# Patient Record
Sex: Male | Born: 1950 | Race: White | Hispanic: No | Marital: Married | Smoking: Former smoker
Health system: Southern US, Community
[De-identification: ages and names within clinical notes are randomized; demographics above are authoritative.]

## PROBLEM LIST (undated history)

## (undated) DIAGNOSIS — M1990 Unspecified osteoarthritis, unspecified site: Secondary | ICD-10-CM

## (undated) DIAGNOSIS — T7840XA Allergy, unspecified, initial encounter: Secondary | ICD-10-CM

## (undated) DIAGNOSIS — R351 Nocturia: Principal | ICD-10-CM

## (undated) DIAGNOSIS — N401 Enlarged prostate with lower urinary tract symptoms: Secondary | ICD-10-CM

## (undated) DIAGNOSIS — E785 Hyperlipidemia, unspecified: Secondary | ICD-10-CM

## (undated) HISTORY — PX: KNEE ARTHROSCOPY: SUR90

## (undated) HISTORY — DX: Allergy, unspecified, initial encounter: T78.40XA

## (undated) HISTORY — DX: Hyperlipidemia, unspecified: E78.5

## (undated) HISTORY — DX: Nocturia: R35.1

## (undated) HISTORY — PX: ROTATOR CUFF REPAIR: SHX139

## (undated) HISTORY — PX: COLONOSCOPY: SHX174

## (undated) HISTORY — DX: Benign prostatic hyperplasia with lower urinary tract symptoms: N40.1

## (undated) HISTORY — DX: Unspecified osteoarthritis, unspecified site: M19.90

---

## 1971-02-02 HISTORY — PX: APPENDECTOMY: SHX54

## 2000-01-19 ENCOUNTER — Ambulatory Visit (HOSPITAL_BASED_OUTPATIENT_CLINIC_OR_DEPARTMENT_OTHER): Admission: RE | Admit: 2000-01-19 | Discharge: 2000-01-19 | Payer: Self-pay | Admitting: Orthopedic Surgery

## 2002-10-22 ENCOUNTER — Ambulatory Visit (HOSPITAL_COMMUNITY): Admission: RE | Admit: 2002-10-22 | Discharge: 2002-10-22 | Payer: Self-pay | Admitting: Gastroenterology

## 2006-03-01 ENCOUNTER — Ambulatory Visit: Payer: Self-pay | Admitting: Family Medicine

## 2006-08-11 ENCOUNTER — Ambulatory Visit: Payer: Self-pay | Admitting: Family Medicine

## 2006-08-12 ENCOUNTER — Ambulatory Visit: Payer: Self-pay | Admitting: Family Medicine

## 2006-08-14 ENCOUNTER — Emergency Department (HOSPITAL_COMMUNITY): Admission: EM | Admit: 2006-08-14 | Discharge: 2006-08-14 | Payer: Self-pay | Admitting: Emergency Medicine

## 2006-08-15 ENCOUNTER — Telehealth: Payer: Self-pay | Admitting: Family Medicine

## 2006-08-15 ENCOUNTER — Encounter: Payer: Self-pay | Admitting: Family Medicine

## 2006-08-15 DIAGNOSIS — J309 Allergic rhinitis, unspecified: Secondary | ICD-10-CM | POA: Insufficient documentation

## 2006-08-15 DIAGNOSIS — E785 Hyperlipidemia, unspecified: Secondary | ICD-10-CM | POA: Insufficient documentation

## 2006-09-07 ENCOUNTER — Ambulatory Visit: Payer: Self-pay | Admitting: Family Medicine

## 2006-09-07 LAB — CONVERTED CEMR LAB
ALT: 29 units/L (ref 0–53)
AST: 22 units/L (ref 0–37)
Albumin: 4.2 g/dL (ref 3.5–5.2)
Alkaline Phosphatase: 37 units/L — ABNORMAL LOW (ref 39–117)
BUN: 17 mg/dL (ref 6–23)
Basophils Absolute: 0 10*3/uL (ref 0.0–0.1)
Basophils Relative: 1 % (ref 0.0–1.0)
Bilirubin Urine: NEGATIVE
Bilirubin, Direct: 0.1 mg/dL (ref 0.0–0.3)
Blood in Urine, dipstick: NEGATIVE
CO2: 29 meq/L (ref 19–32)
Calcium: 9.3 mg/dL (ref 8.4–10.5)
Chloride: 106 meq/L (ref 96–112)
Cholesterol: 172 mg/dL (ref 0–200)
Creatinine, Ser: 1.1 mg/dL (ref 0.4–1.5)
Eosinophils Absolute: 0.1 10*3/uL (ref 0.0–0.6)
Eosinophils Relative: 2.4 % (ref 0.0–5.0)
GFR calc Af Amer: 89 mL/min
GFR calc non Af Amer: 73 mL/min
Glucose, Bld: 103 mg/dL — ABNORMAL HIGH (ref 70–99)
Glucose, Urine, Semiquant: NEGATIVE
HCT: 42.6 % (ref 39.0–52.0)
HDL: 43.2 mg/dL (ref >39.0)
Hemoglobin: 14.7 g/dL (ref 13.0–17.0)
Ketones, urine, test strip: NEGATIVE
LDL Cholesterol: 113 mg/dL — ABNORMAL HIGH (ref 0–99)
Lymphocytes Relative: 35 % (ref 12.0–46.0)
MCHC: 34.5 g/dL (ref 30.0–36.0)
MCV: 90.8 fL (ref 78.0–100.0)
Monocytes Absolute: 0.5 10*3/uL (ref 0.2–0.7)
Monocytes Relative: 10.4 % (ref 3.0–11.0)
Neutro Abs: 2.6 10*3/uL (ref 1.4–7.7)
Neutrophils Relative %: 51.2 % (ref 43.0–77.0)
Nitrite: NEGATIVE
PSA: 1.01 ng/mL (ref 0.10–4.00)
Platelets: 176 10*3/uL (ref 150–400)
Potassium: 4 meq/L (ref 3.5–5.1)
Protein, U semiquant: NEGATIVE
RBC: 4.69 M/uL (ref 4.22–5.81)
RDW: 12.6 % (ref 11.5–14.6)
Sodium: 141 meq/L (ref 135–145)
Specific Gravity, Urine: 1.02
TSH: 0.8 microintl units/mL (ref 0.35–5.50)
Total Bilirubin: 0.7 mg/dL (ref 0.3–1.2)
Total CHOL/HDL Ratio: 4
Total Protein: 6.6 g/dL (ref 6.0–8.3)
Triglycerides: 81 mg/dL (ref 0–149)
Urobilinogen, UA: 0.2
VLDL: 16 mg/dL (ref 0–40)
WBC Urine, dipstick: NEGATIVE
WBC: 4.9 10*3/uL (ref 4.5–10.5)
pH: 5.5

## 2006-09-14 ENCOUNTER — Ambulatory Visit: Payer: Self-pay | Admitting: Family Medicine

## 2006-11-14 ENCOUNTER — Ambulatory Visit: Payer: Self-pay | Admitting: Family Medicine

## 2006-11-14 LAB — CONVERTED CEMR LAB
Cholesterol: 200 mg/dL (ref 0–200)
HDL: 41.8 mg/dL (ref >39.0)
LDL Cholesterol: 145 mg/dL — ABNORMAL HIGH (ref 0–99)
Total CHOL/HDL Ratio: 4.8
Triglycerides: 68 mg/dL (ref 0–149)
VLDL: 14 mg/dL (ref 0–40)

## 2006-11-17 ENCOUNTER — Telehealth: Payer: Self-pay | Admitting: Family Medicine

## 2007-03-13 DIAGNOSIS — J069 Acute upper respiratory infection, unspecified: Secondary | ICD-10-CM | POA: Insufficient documentation

## 2007-03-16 ENCOUNTER — Ambulatory Visit: Payer: Self-pay | Admitting: Family Medicine

## 2007-03-28 ENCOUNTER — Ambulatory Visit: Payer: Self-pay | Admitting: Family Medicine

## 2007-03-28 DIAGNOSIS — H6590 Unspecified nonsuppurative otitis media, unspecified ear: Secondary | ICD-10-CM | POA: Insufficient documentation

## 2007-06-14 ENCOUNTER — Ambulatory Visit: Payer: Self-pay | Admitting: Family Medicine

## 2007-06-14 ENCOUNTER — Encounter: Payer: Self-pay | Admitting: Family Medicine

## 2007-06-14 DIAGNOSIS — L820 Inflamed seborrheic keratosis: Secondary | ICD-10-CM | POA: Insufficient documentation

## 2007-06-22 ENCOUNTER — Telehealth: Payer: Self-pay | Admitting: Family Medicine

## 2007-07-18 ENCOUNTER — Ambulatory Visit: Payer: Self-pay | Admitting: Family Medicine

## 2008-12-20 ENCOUNTER — Ambulatory Visit: Payer: Self-pay | Admitting: Family Medicine

## 2008-12-20 DIAGNOSIS — L259 Unspecified contact dermatitis, unspecified cause: Secondary | ICD-10-CM | POA: Insufficient documentation

## 2008-12-24 ENCOUNTER — Telehealth: Payer: Self-pay | Admitting: Family Medicine

## 2008-12-25 ENCOUNTER — Ambulatory Visit: Payer: Self-pay | Admitting: Family Medicine

## 2009-03-18 ENCOUNTER — Ambulatory Visit: Payer: Self-pay | Admitting: Family Medicine

## 2009-03-18 DIAGNOSIS — M5020 Other cervical disc displacement, unspecified cervical region: Secondary | ICD-10-CM | POA: Insufficient documentation

## 2009-04-01 ENCOUNTER — Ambulatory Visit: Payer: Self-pay | Admitting: Family Medicine

## 2009-04-03 ENCOUNTER — Telehealth: Payer: Self-pay | Admitting: Family Medicine

## 2009-10-03 ENCOUNTER — Ambulatory Visit: Payer: Self-pay | Admitting: Family Medicine

## 2009-10-03 LAB — CONVERTED CEMR LAB
ALT: 16 units/L (ref 0–53)
AST: 17 units/L (ref 0–37)
Albumin: 4.1 g/dL (ref 3.5–5.2)
Alkaline Phosphatase: 34 units/L — ABNORMAL LOW (ref 39–117)
BUN: 19 mg/dL (ref 6–23)
Basophils Absolute: 0 10*3/uL (ref 0.0–0.1)
Basophils Relative: 0.9 % (ref 0.0–3.0)
Bilirubin Urine: NEGATIVE
Bilirubin, Direct: 0.1 mg/dL (ref 0.0–0.3)
Blood in Urine, dipstick: NEGATIVE
CO2: 31 meq/L (ref 19–32)
Calcium: 9.2 mg/dL (ref 8.4–10.5)
Chloride: 104 meq/L (ref 96–112)
Cholesterol: 212 mg/dL — ABNORMAL HIGH (ref 0–200)
Creatinine, Ser: 1.2 mg/dL (ref 0.4–1.5)
Direct LDL: 158.1 mg/dL
Eosinophils Absolute: 0.1 10*3/uL (ref 0.0–0.7)
Eosinophils Relative: 1.2 % (ref 0.0–5.0)
GFR calc non Af Amer: 68.7 mL/min (ref >60)
Glucose, Bld: 85 mg/dL (ref 70–99)
Glucose, Urine, Semiquant: NEGATIVE
HCT: 42.9 % (ref 39.0–52.0)
HDL: 48.5 mg/dL (ref >39.00)
Hemoglobin: 14.6 g/dL (ref 13.0–17.0)
Ketones, urine, test strip: NEGATIVE
Lymphocytes Relative: 37.2 % (ref 12.0–46.0)
Lymphs Abs: 1.8 10*3/uL (ref 0.7–4.0)
MCHC: 34 g/dL (ref 30.0–36.0)
MCV: 92.3 fL (ref 78.0–100.0)
Monocytes Absolute: 0.4 10*3/uL (ref 0.1–1.0)
Monocytes Relative: 9.2 % (ref 3.0–12.0)
Neutro Abs: 2.5 10*3/uL (ref 1.4–7.7)
Neutrophils Relative %: 51.5 % (ref 43.0–77.0)
Nitrite: NEGATIVE
PSA: 1.86 ng/mL (ref 0.10–4.00)
Platelets: 169 10*3/uL (ref 150.0–400.0)
Potassium: 4.8 meq/L (ref 3.5–5.1)
Protein, U semiquant: NEGATIVE
RBC: 4.65 M/uL (ref 4.22–5.81)
RDW: 13.7 % (ref 11.5–14.6)
Sodium: 140 meq/L (ref 135–145)
Specific Gravity, Urine: <1.005
TSH: 1 microintl units/mL (ref 0.35–5.50)
Total Bilirubin: 0.7 mg/dL (ref 0.3–1.2)
Total CHOL/HDL Ratio: 4
Total Protein: 6.2 g/dL (ref 6.0–8.3)
Triglycerides: 72 mg/dL (ref 0.0–149.0)
Urobilinogen, UA: 0.2
VLDL: 14.4 mg/dL (ref 0.0–40.0)
WBC Urine, dipstick: NEGATIVE
WBC: 4.9 10*3/uL (ref 4.5–10.5)
pH: 5.5

## 2009-10-10 ENCOUNTER — Ambulatory Visit: Payer: Self-pay | Admitting: Family Medicine

## 2010-02-19 ENCOUNTER — Encounter
Admission: RE | Admit: 2010-02-19 | Discharge: 2010-02-19 | Payer: Self-pay | Source: Home / Self Care | Attending: Neurological Surgery | Admitting: Neurological Surgery

## 2010-03-05 NOTE — Progress Notes (Signed)
Summary: tingling fingers  Phone Note Call from Patient   Summary of Call: patient is now having tingling with his fingers on his left hand.  some pain with the upper shoulder.  [phone] cell number Initial call taken by: Kern Reap CMA Duncan Dull),  April 03, 2009 2:27 PM  Follow-up for Phone Call        it's time for a neurosurgical consult Dr. Danielle Dess, and his group Follow-up by: Roderick Pee MD,  April 03, 2009 3:06 PM

## 2010-03-05 NOTE — Assessment & Plan Note (Signed)
Summary: ear pain/njr   Vital Signs:  Patient Profile:   60 Years Old Male Height:     71 inches (180.34 cm) Weight:      200 pounds Temp:     98.0 degrees F BP sitting:   130 / 90  Vitals Entered By: Sindy Guadeloupe RN (March 28, 2007 4:16 PM)                 Chief Complaint:  right ear pain continues--also sore throat and tongue.  History of Present Illness: Jacob [name] is a 60 year old male, who comes back today for follow-up of a viral infection.  He has underlying allergic rhinitis and developed a viral infection.  Two weeks ago.  The viral symptoms are gone, but he can't hear.  Both ears seem full.  Has slight pain in his left ear, but otherwise is fine.  He said no fever, sore throat, cough, nausea, vomiting, or diarrhea.  Is a nonsmoker.    Current Allergies (reviewed today): No known allergies   Past Medical History:    Reviewed history from 08/15/2006 and no changes required:       Allergic rhinitis       Hyperlipidemia       left knee scoped and 80 to 99       appendectomy       tonsillectomy   Family History:    Reviewed history and no changes required:       father and glaucoma, and osteoarthritis       mother died at 30 of sarcoidosis       two brothers in good health two sisters one has hypertension  Social History:    Reviewed history and no changes required:       Occupation: [employer]       Married       Never Smoked       Alcohol use-no       Drug use-no       Regular exercise-yes   Risk Factors:  Tobacco use:  never Drug use:  no Alcohol use:  no Exercise:  yes   Review of Systems      See HPI   Physical Exam  General:     Well-developed,well-nourished,in no acute distress; alert,appropriate and cooperative throughout examination Head:     Normocephalic and atraumatic without obvious abnormalities. No apparent alopecia or balding. Eyes:     No corneal or conjunctival inflammation noted. EOMI. Perrla. Funduscopic exam benign,  without hemorrhages, exudates or papilledema. Vision grossly normal. Ears:     bilateral serous otitis media Nose:     External nasal examination shows no deformity or inflammation. Nasal mucosa are pink and moist without lesions or exudates. Mouth:     Oral mucosa and oropharynx without lesions or exudates.  Teeth in good repair. Neck:     No deformities, masses, or tenderness noted. Chest Wall:     No deformities, masses, tenderness or gynecomastia noted. Breasts:     No masses or gynecomastia noted Lungs:     Normal respiratory effort, chest expands symmetrically. Lungs are clear to auscultation, no crackles or wheezes. Heart:     Normal rate and regular rhythm. S1 and S2 normal without gallop, murmur, click, rub or other extra sounds.    Impression & Recommendations:  Problem # 1:  OTITIS MEDIA, SEROUS, BILATERAL (ICD-381.4) Assessment: New  Complete Medication List: 1)  Zyrtec Allergy 10 Mg Tabs (Cetirizine hcl) .Marland Kitchen.. 1 once daily 2)  Cvs Ibuprofen 200 Mg Caps (Ibuprofen) .... Two qid ear pain 3)  Prednisone 20 Mg Tabs (Prednisone) .... Uad   Patient Instructions: 1)  take prednisone two tablets daily for 3 days, and one a day for 3 days, a half a tablet a day for 3 days, then half a tablet every other day for two week taper. 2)  Also, use Afrin nasal spray, and saline nasal spray at bedtime.  After 5 nights stop the Afrin.    Prescriptions: PREDNISONE 20 MG  TABS (PREDNISONE) UAD  #50 x 1   Entered and Authorized by:   Roderick Pee MD   Signed by:   Roderick Pee MD on 03/28/2007   Method used:   Electronically sent to ...       [facility]*       [address]       [place], [place]  [address]       Ph: [phone] or [phone]       Fax: [phone]   RxID:   [id number]  ]

## 2010-03-05 NOTE — Assessment & Plan Note (Signed)
Summary: skin patch/mhf   Vital Signs:  Patient Profile:   60 Years Old Male Height:     71 inches (180.34 cm) Weight:      200 pounds Temp:     98.4 degrees F oral BP sitting:   116 / 78  (left arm)  Vitals Entered By: Doristine Devoid (Jun 14, 2007 11:39 AM)                 Procedure Note Last Tetanus: Td (02/01/2002)  Mole Biopsy/Removal: Indication: changing lesion  Procedure # 1: elliptical incision with 3 mm margin    Size (in cm): 1.0 x 1.0    Region: medial    Location: left upper arm    Instrument used: #15 blade    Anesthesia: 0.5 ml 1% lidocaine w/epinephrine   Chief Complaint:  left elbow patch raised.  History of Present Illness: Al is a 60 year old male, who comes in today for evaluation of an enlarging lesion on his left forearm.  He notices starting to grow thereabout to 3 months ago.  Over the last couple weeks has increased in size in its been irritated.  He's had no previous history of skin cancer.  [place]  He did get a lot of sun exposure.    Current Allergies: No known allergies         Impression & Recommendations:  Problem # 1:  SEBORRHEIC KERATOSIS, INFLAMED (ICD-702.11) Assessment: New  Orders: Shave Skin Lesion 0.6-1.0 cm/trunk/arm/leg (11301)   Complete Medication List: 1)  Zyrtec Allergy 10 Mg Tabs (Cetirizine hcl) .Marland Kitchen.. 1 once daily 2)  Cvs Ibuprofen 200 Mg Caps (Ibuprofen) .... Two qid ear pain 3)  Prednisone 20 Mg Tabs (Prednisone) .... Uad   Patient Instructions: 1)  remove the Band-Aid tomorrow.  I will call you within two weeks with the report.  If within two weeks.  I do not call you and you call me   ]

## 2010-03-05 NOTE — Assessment & Plan Note (Signed)
Summary: fu on growth on arm/njr   Vital Signs:  Patient Profile:   60 Years Old Male Height:     71 inches (180.34 cm) Weight:      201 pounds Temp:     97.8 degrees F oral BP sitting:   140 / 94  (left arm)  Vitals Entered By: Kern Reap CMA (July 18, 2007 10:55 AM)                 Chief Complaint:  concerns with growth on left arm.    Current Allergies: No known allergies         Complete Medication List: 1)  Zyrtec Allergy 10 Mg Tabs (Cetirizine hcl) .Marland Kitchen.. 1 once daily    ]

## 2010-03-05 NOTE — Assessment & Plan Note (Signed)
Summary: R SHOULDER PAIN // RS   Vital Signs:  Patient profile:   60 year old male Weight:      200 pounds Temp:     98.3 degrees F BP sitting:   138 / 98  (left arm) Cuff size:   regular  Vitals Entered By: Kern Reap CMA Duncan Dull) (March 18, 2009 2:03 PM)  Reason for Visit right shoulder pain  History of Present Illness: Jacob [name] is a 60-year-old male, who comes in today for evaluation of pain on the left side of his neck radiating down to his right elbow since December the 23rd 2010.  At that point.  He was doing push-ups and noticed a sudden onset of pain.  He's had this problem before couple years ago was treated conservatively with physical therapy and resolved over 4 to 6 weeks.  This time.  The pain is not resolved.  He describes it as constant, dull, occasionally sharp, shooting pain down his right elbow.  On a scale of one to 10 at 6.  It also keeps him awake at night.  He denies any numbness or muscle strain changes.  No history of trauma to his neck  Allergies (verified): No Known Drug Allergies  Past History:  Past medical, surgical, family and social histories (including risk factors) reviewed for relevance to current acute and chronic problems.  Past Medical History: Reviewed history from 03/28/2007 and no changes required. Allergic rhinitis Hyperlipidemia left knee scoped and 80 to 99 appendectomy tonsillectomy  Past Surgical History: Reviewed history from 08/15/2006 and no changes required. L knee scope x2  Appendectomy-73' Tonsillectomyand adnoids-55' cyst R pulm.  Family History: Reviewed history from 03/28/2007 and no changes required. father and glaucoma, and osteoarthritis mother died at 56 of sarcoidosis two brothers in good health two sisters one has hypertension  Social History: Reviewed history from 03/28/2007 and no changes required. Occupation: [employer] Married Never Smoked Alcohol use-no Drug use-no Regular exercise-yes  Review of  Systems      See HPI  Physical Exam  General:  Well-developed,well-nourished,in no acute distress; alert,appropriate and cooperative throughout examination Msk:  No deformity or scoliosis noted of thoracic or lumbar spine.   Pulses:  R and L carotid,radial,femoral,dorsalis pedis and posterior tibial pulses are full and equal bilaterally Extremities:  No clubbing, cyanosis, edema, or deformity noted with normal full range of motion of all joints.   Neurologic:  No cranial nerve deficits noted. Station and gait are normal. Plantar reflexes are down-going bilaterally. DTRs are symmetrical throughout. Sensory, motor and coordinative functions appear intact.   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 1 once daily 2)  Fish Oil 1000 Mg Caps (Omega-3 fatty acids) .... Take two tabs once daily 3)  Daily Vitamins Tabs (Multiple vitamin) .... Once daily 4)  Flexeril 10 Mg Tabs (Cyclobenzaprine hcl) .Marland Kitchen.. 1 tab @ bedtime 5)  Vicodin Es 7.5-750 Mg Tabs (Hydrocodone-acetaminophen) .Marland Kitchen.. 1 tab @ bedtime 6)  Prednisone 20 Mg Tabs (Prednisone) .... Uad  Patient Instructions: 1)  begin prednisone two tabs x 3 days, one x 3 days, a half x 3 days, then half a tablet Monday, Wednesday, Friday, for a two-week taper. 2)  Wear a soft cervical collar as much as possible. 3)  He may take a half of the Flexeril and/or Vicodin at bedtime  for severe nighttime pain.  We will start shoe with physical therapy ASAP. 4)  Return in two weeks for follow-up, sooner if any problems Prescriptions: PREDNISONE 20 MG TABS (PREDNISONE) UAD  #30 x 1   Entered and Authorized by:   Roderick Pee MD   Signed by:   Roderick Pee MD on 03/18/2009   Method used:   Print then Give to Patient   RxID:    [id number] VICODIN ES 7.5-750 MG TABS (HYDROCODONE-ACETAMINOPHEN) 1 tab @ bedtime  #30 x 1   Entered and Authorized by:   Roderick Pee MD   Signed by:   Roderick Pee MD on 03/18/2009   Method used:   Print then Give to Patient   RxID:   [id number] FLEXERIL 10 MG TABS (CYCLOBENZAPRINE HCL) 1 tab @ bedtime  #30 x 1   Entered and Authorized by:   Roderick Pee MD   Signed by:   Roderick Pee MD on 03/18/2009   Method used:   Print then Give to Patient   RxID:   [id number]

## 2010-03-05 NOTE — Assessment & Plan Note (Signed)
Summary: rash/?related to medication/cjr   Vital Signs:  Patient profile:   60 year old male BP sitting:   110 / 78  (left arm) Cuff size:   regular  Vitals Entered By: Kern Reap CMA Duncan Dull) (December 25, 2008 9:43 AM)  Reason for Visit rash  History of Present Illness: al is a 60 year old, married male, nonsmoker, who comes in today for evaluation of the total body skin rash.  Over the weekend.  He was doing yard work and then 3 days later he began breaking out in total body with whelps.  Review of systems negative  Allergies: No Known Drug Allergies  Past History:  Past medical, surgical, family and social histories (including risk factors) reviewed for relevance to current acute and chronic problems.  Past Medical History: Reviewed history from 03/28/2007 and no changes required. Allergic rhinitis Hyperlipidemia left knee scoped and 80 to 99 appendectomy tonsillectomy  Past Surgical History: Reviewed history from 08/15/2006 and no changes required. L knee scope x2  Appendectomy-73' Tonsillectomyand adnoids-55' cyst R pulm.  Family History: Reviewed history from 03/28/2007 and no changes required. father and glaucoma, and osteoarthritis mother died at 34 of sarcoidosis two brothers in good health two sisters one has hypertension  Social History: Reviewed history from 03/28/2007 and no changes required. Occupation: [employer] Married Never Smoked Alcohol use-no Drug use-no Regular exercise-yes  Review of Systems      See HPI  Physical Exam  General:  Well-developed,well-nourished,in no acute distress; alert,appropriate and cooperative throughout examination Skin:  total body rash, consistent with a contact them at   Impression & Recommendations:  Problem # 1:  CONTACT DERMATITIS&OTHER ECZEMA DUE UNSPEC CAUSE (ICD-692.9) Assessment Deteriorated  His updated medication list for this problem includes:    Zyrtec Allergy 10 Mg Tabs (Cetirizine  hcl) .Marland Kitchen... 1 once daily    Fluocinonide 0.05 % Oint (Fluocinonide) .Marland Kitchen... Apply two times a day    Prednisone 20 Mg Tabs (Prednisone) ..... Uad  Orders: Prescription Created Electronically [id number])  Complete Medication List: 1)  Zyrtec Allergy 10 Mg Tabs (Cetirizine hcl) .Marland Kitchen.. 1 once daily 2)  Fish Oil 1000 Mg Caps (Omega-3 fatty acids) .... Take two tabs once daily 3)  Daily Vitamins Tabs (Multiple vitamin) .... Once daily 4)  Fluocinonide 0.05 % Oint (Fluocinonide) .... Apply two times a day 5)  Prednisone 20 Mg Tabs (Prednisone) .... Uad  Patient Instructions: 1)  begin prednisone, take two tabs x 3 days, one x 3 days, a half x 3 days, then half a tablet Monday, Wednesday, Friday, for a two week taper.  Return p.r.n. Prescriptions: PREDNISONE 20 MG TABS (PREDNISONE) UAD  #30 x 1   Entered and Authorized by:   Roderick Pee MD   Signed by:   Roderick Pee MD on 12/25/2008   Method used:   Electronically to        [facility]* (retail)       [address]       [place], [place]  [id number]       Ph: [phone]       Fax: [phone]   RxID:   [id number]

## 2010-03-05 NOTE — Progress Notes (Signed)
Summary: SHINGLED DX AT [facility]  Phone Note Call from Patient   Summary of Call: DR. TODD SUNDAY PT TO ER AT [place], DX SHINGLES, ON VALTREX 1 GM  AND ENDOCET 10/650 AND CONT THRU THURSDAY CEPHELEXIN 500 MG. YESTERDAY TOL MEDS FINE, TODAY EXPERIENCING FACIAL SWELLING NOT RESPONDING TO ICE COMPRESSES AND NEED ADVICE [facility] [place] Mariel Aloe AT ER Initial call taken by: Sid Falcon LPN,  August 15, 2006 12:21 PM  Follow-up for Phone Call        Phone Call Completed Follow-up by: Roderick Pee MD,  August 16, 2006 7:51 AM  Additional Follow-up for Phone Call Additional follow up Details #1::        Phone Call Completed

## 2010-03-05 NOTE — Assessment & Plan Note (Signed)
Summary: ear and cold/mhf   Vital Signs:  Patient Profile:   60 Years Old Male Height:     71 inches (180.34 cm) Weight:      196 pounds (89.09 kg) Temp:     98.6 degrees F (37.00 degrees C) oral Pulse rate:   74 / minute BP sitting:   131 / 86  Vitals Entered By: Arcola Jansky, RN (March 16, 2007 10:55 AM)                 Chief Complaint:  "COLD , EAR POPPING & RINGING, COUGHING, MUCUS, and ONSET MON".  History of Present Illness: All is a 60 year old male who comes in to see [place] today for evaluation of 4 days, history of head congestion, sore throat, cough, and right ear ache.  He, states he's had cold symptoms since Monday.  Tuesday he was coughing and felt pain in his right ear.  It went away.  There is no leakage or drainage.  He has no vertigo.  Is a nonsmoker.  Is otherwise been in good health    Current Allergies (reviewed today): No known allergies      Review of Systems      See HPI   Physical Exam  General:     Well-developed,well-nourished,in no acute distress; alert,appropriate and cooperative throughout examination Head:     Normocephalic and atraumatic without obvious abnormalities. No apparent alopecia or balding. Eyes:     No corneal or conjunctival inflammation noted. EOMI. Perrla. Funduscopic exam benign, without hemorrhages, exudates or papilledema. Vision grossly normal. Ears:     left eardrum normal right there are trauma with blood Nose:     External nasal examination shows no deformity or inflammation. Nasal mucosa are pink and moist without lesions or exudates. Mouth:     Oral mucosa and oropharynx without lesions or exudates.  Teeth in good repair. Neck:     No deformities, masses, or tenderness noted. Lungs:     Normal respiratory effort, chest expands symmetrically. Lungs are clear to auscultation, no crackles or wheezes.    Impression & Recommendations:  Problem # 1:  VIRAL URI (ICD-465.9) Assessment: New  His  updated medication list for this problem includes:    Zyrtec Allergy 10 Mg Tabs (Cetirizine hcl) .Marland Kitchen... 1 once daily    Hycodan 5-1.5 Mg/81ml Syrp (Hydrocodone-homatropine) .Marland Kitchen... 1 or 2 tsps. three times a day as needed   Complete Medication List: 1)  Zyrtec Allergy 10 Mg Tabs (Cetirizine hcl) .Marland Kitchen.. 1 once daily 2)  Vicodin Es 7.5-750 Mg Tabs (Hydrocodone-acetaminophen) .... As needed uad 3)  Hycodan 5-1.5 Mg/25ml Syrp (Hydrocodone-homatropine) .Marland Kitchen.. 1 or 2 tsps. three times a day as needed   Patient Instructions: 1)  Get plenty of rest, drink lots of clear liquids, and use tylenol or Ibuprophen for fever and comfort. return in 7-10 days if you're not better:sooner if you're feeliong worse. 2)  you may also use Afrin nasal spray, one shot up each nostril at bedtime.  However, there is a 5 nightly, Manon, Afrin.  Take one or 2 teaspoons of the hydrocodone cough syrup, up to 3 times a day as needed for sore throat, head congestion and cough    Prescriptions: HYCODAN 5-1.5 MG/5ML  SYRP (HYDROCODONE-HOMATROPINE) 1 or 2 tsps. three times a day as needed  #8 oz.1 x 1   Entered and Authorized by:   Roderick Pee MD   Signed by:   Roderick Pee MD on 03/16/2007  Method used:   Print then Give to Patient   RxID:   [id number]  ]

## 2010-03-05 NOTE — Assessment & Plan Note (Signed)
Summary: cpx/njr   Vital Signs:  Patient Profile:   60 Years Old Male Height:     71 inches Weight:      190 pounds BMI:     26.60 BSA:     2.07 Temp:     9.2 degrees F oral BP sitting:   140 / 80  (left arm)  Pt. in pain?   no                Chief Complaint:  cpx , labs done, and "?on  cholesterol meds& shingles".  History of Present Illness: Jacob [name] comes in today for physical evaluation.  He was diagnosed by Dr. Arlyce Dice in 2002 to have hyperlipidemia.  He's been on Zocor since that time, 40 mg nightly.  His lipids at that time or not all that often.  He wonders if he should come off of that.  He has no major risk factors.  He exercises on a regular basis.  He is a nonsmoker.  He does not have diabetes nor hypertension.  Genetically his father's 88 in good health.  Mother died in late 16s of sarcoidosis.  No family history coronary disease in the family.  He also takes Zyrtec 10 mg nightly for allergic rhinitis.  He tried to take an aspirin tablet a day, but caused a GI upset.  Therefore, he stopped it.  He's now getting over a bad case of shingles involved.  His head.  We initially saw him and thought it was contacted.  Dermatitis.  Will put him on prednisone, but in a day or two he developed a rash.  He then went to the emergency room and was placed on antivirals and has done well except he still has a residual rash is healing in some occasional tingling and itching and pain.  The symptoms, come and go.  We discussed adding Neurontin, etc., to help he wants to hold off on that.  His past medical history is significant in that his left knee scoped twice an appendectomy a tonsillectomy and a cyst removed from his right-hand.  Other than that he said no major illnesses or injuries.  No med allergies again.  He does not smoke.  Review of systems he gets his eyes checked yearly for glaucoma because his dad had glaucoma.  He gets regular dental care.  Had a colonoscopy in 2006 by Dr. Loreta Ave that was  normal.  She told him to come back in 10 years.  He does have allergic rhinitis, for which he takes Zyrtec, otherwise, his review of systems is negative.  Social history he is married, lives in [place] from [place] originally from [place].  He works for [employer].  He has two sons in good health.  No problems.  Family history.  This 88 has zoster, arthritis, and glaucoma.  Mom died at 9 of sarcoidosis.  Two broth  Acute Visit History:      He denies abdominal pain, chest pain, constipation, cough, diarrhea, earache, eye symptoms, fever, genitourinary symptoms, headache, musculoskeletal symptoms, nasal discharge, nausea, rash, sinus problems, sore throat, and vomiting.         Current Allergies: No known allergies   Past Medical History:    Reviewed history from 08/15/2006 and no changes required:       Allergic rhinitis       Hyperlipidemia   Family History:    Reviewed history and no changes required:  Social History:    Reviewed history and no changes  required:   Risk Factors:  HIV high-risk behavior:  no Alcohol use:  yes    Counseled to quit/cut down alcohol use:  no Exercise:  yes  Family History Risk Factors:    Family History of MI in males < 82 years old:  no   Review of Systems      See HPI   Physical Exam  General:     Well-developed,well-nourished,in no acute distress; alert,appropriate and cooperative throughout examination Head:     Normocephalic and atraumatic without obvious abnormalities. No apparent alopecia or balding. Eyes:     No corneal or conjunctival inflammation noted. EOMI. Perrla. Funduscopic exam benign, without hemorrhages, exudates or papilledema. Vision grossly normal. Ears:     External ear exam shows no significant lesions or deformities.  Otoscopic examination reveals clear canals, tympanic membranes are intact bilaterally without bulging, retraction, inflammation or discharge. Hearing is grossly normal bilaterally. Nose:      External nasal examination shows no deformity or inflammation. Nasal mucosa are pink and moist without lesions or exudates. Mouth:     Oral mucosa and oropharynx without lesions or exudates.  Teeth in good repair. Neck:     No deformities, masses, or tenderness noted. Chest Wall:     No deformities, masses, tenderness or gynecomastia noted. Breasts:     No masses or gynecomastia noted Lungs:     Normal respiratory effort, chest expands symmetrically. Lungs are clear to auscultation, no crackles or wheezes. Heart:     Normal rate and regular rhythm. S1 and S2 normal without gallop, murmur, click, rub or other extra sounds. Abdomen:     Bowel sounds positive,abdomen soft and non-tender without masses, organomegaly or hernias noted. Rectal:     No external abnormalities noted. Normal sphincter tone. No rectal masses or tenderness. Genitalia:     Testes bilaterally descended without nodularity, tenderness or masses. No scrotal masses or lesions. No penis lesions or urethral discharge. Prostate:     Prostate gland firm and smooth, no enlargement, nodularity, tenderness, mass, asymmetry or induration. Msk:     No deformity or scoliosis noted of thoracic or lumbar spine.   Pulses:     R and L carotid,radial,femoral,dorsalis pedis and posterior tibial pulses are full and equal bilaterally Extremities:     No clubbing, cyanosis, edema, or deformity noted with normal full range of motion of all joints.   Neurologic:     No cranial nerve deficits noted. Station and gait are normal. Plantar reflexes are down-going bilaterally. DTRs are symmetrical throughout. Sensory, motor and coordinative functions appear intact. Skin:     he has erythematous lesions on his face and scalp from healing shingles Cervical Nodes:     No lymphadenopathy noted Axillary Nodes:     No palpable lymphadenopathy Inguinal Nodes:     No significant adenopathy Psych:     Cognition and judgment appear intact. Alert and  cooperative with normal attention span and concentration. No apparent delusions, illusions, hallucinations    Impression & Recommendations:  Problem # 1:  HEALTH SCREENING (ICD-V70.0)  Orders: EKG w/ Interpretation (93000)   Problem # 2:  ALLERGIC RHINITIS (ICD-477.9) Assessment: Unchanged  His updated medication list for this problem includes:    Zyrtec Allergy 10 Mg Tabs (Cetirizine hcl) .Marland Kitchen... 1 once daily   Problem # 3:  HYPERLIPIDEMIA (ICD-272.4) Assessment: Improved  His updated medication list for this problem includes:    Simvastatin 40 Mg Tabs (Simvastatin) .Marland Kitchen... 1 at bedtime   Complete Medication List:  1)  Simvastatin 40 Mg Tabs (Simvastatin) .Marland Kitchen.. 1 at bedtime 2)  Zyrtec Allergy 10 Mg Tabs (Cetirizine hcl) .Marland Kitchen.. 1 once daily 3)  Vicodin Es 7.5-750 Mg Tabs (Hydrocodone-acetaminophen) .... As needed uad   Patient Instructions: 1)  your physical exam today, was normal.  Since her at low risk for coronary disease, but stopped the Zocor for two months.  In October, less get a fasting lipid panel and see which her numbers are.  Please set this up before you leave.  All you need to do is come and get a fasting lipid panel and then go on.  I will call you in over the phone and gave you report. 2)  Please schedule a follow-up appointment in 1 year.

## 2010-03-05 NOTE — Progress Notes (Signed)
  Phone Note Outgoing Call   Call placed by: jat Call placed to: Patient Summary of Call: I and left a message in his voice mail that the lesion removed was benign.  However, there were some changes indicated.  It was probably a premalignant lesion.  Therefore, advised him to watch her carefully any evidence of recurrence.  Return for reexcision Initial call taken by: Roderick Pee MD,  Jun 22, 2007 8:37 AM

## 2010-03-05 NOTE — Assessment & Plan Note (Signed)
Summary: CPX // RS   Vital Signs:  Patient profile:   60 year old male Height:      70.5 inches Weight:      200 pounds BMI:     28.39 Temp:     98.0 degrees F oral BP sitting:   110 / 90  (left arm) Cuff size:   regular  Vitals Entered By: Kathrynn Speed CMA (October 10, 2009 10:16 AM) CC: cpx, with lab review, src Is Patient Diabetic? No   CC:  cpx, with lab review, and src.  History of Present Illness: Al is a 60 year old, married male, nonsmoker, who comes in today for a general physical examination,  He's always been in excellent health except for some minor allergic rhinitis, for which he takes Zyrtec, OTC one nightly.  He also takes Mobic 1500 mg daily for cervical degenerative disease.  Incidental see the neurosurgeon.  They feel like he has spinal stenosis and did not want to treat him surgically until he develops a persistent neurologic deficit.  At this point in time.  He has some numbness that comes and goes, but no muscle weakness, pain.  He gets routine eye care, dental care, colonoscopy 5 years ago, normal, tetanus, 2004, seasonal flu shot 2011 at work.  Preventive Screening-Counseling & Management  Alcohol-Tobacco     Smoking Status: never  Current Medications (verified): 1)  Zyrtec Allergy 10 Mg  Tabs (Cetirizine Hcl) .Marland Kitchen.. 1 Once Daily 2)  Fish Oil 1400 Mg Caps (Omega-3 Fatty Acids) .... Take Once Daily 3)  Daily Vitamins  Tabs (Multiple Vitamin) .... Once Daily 4)  Mobic 15 Mg Tabs (Meloxicam) .... Once Day  Allergies (verified): No Known Drug Allergies  Past History:  Past medical, surgical, family and social histories (including risk factors) reviewed, and no changes noted (except as noted below).  Past Medical History: Reviewed history from 03/28/2007 and no changes required. Allergic rhinitis Hyperlipidemia left knee scoped and 80 to 99 appendectomy tonsillectomy  Past Surgical History: Reviewed history from 08/15/2006 and no changes  required. L knee scope x2  Appendectomy-73' Tonsillectomyand adnoids-55' cyst R pulm.  Family History: Reviewed history from 03/28/2007 and no changes required. father and glaucoma, and osteoarthritis mother died at 16 of sarcoidosis two brothers in good health two sisters one has hypertension  Social History: Reviewed history from 03/28/2007 and no changes required. Occupation: [employer] Married Never Smoked Alcohol use-no Drug use-no Regular exercise-yes  Review of Systems      See HPI  Physical Exam  General:  Well-developed,well-nourished,in no acute distress; alert,appropriate and cooperative throughout examination Head:  Normocephalic and atraumatic without obvious abnormalities. No apparent alopecia or balding. Eyes:  No corneal or conjunctival inflammation noted. EOMI. Perrla. Funduscopic exam benign, without hemorrhages, exudates or papilledema. Vision grossly normal. Ears:  External ear exam shows no significant lesions or deformities.  Otoscopic examination reveals clear canals, tympanic membranes are intact bilaterally without bulging, retraction, inflammation or discharge. Hearing is grossly normal bilaterally. Nose:  External nasal examination shows no deformity or inflammation. Nasal mucosa are pink and moist without lesions or exudates. Mouth:  Oral mucosa and oropharynx without lesions or exudates.  Teeth in good repair. Neck:  No deformities, masses, or tenderness noted. Chest Wall:  No deformities, masses, tenderness or gynecomastia noted. Breasts:  No masses or gynecomastia noted Lungs:  Normal respiratory effort, chest expands symmetrically. Lungs are clear to auscultation, no crackles or wheezes. Heart:  Normal rate and regular rhythm. S1 and S2 normal  without gallop, murmur, click, rub or other extra sounds. Abdomen:  Bowel sounds positive,abdomen soft and non-tender without masses, organomegaly or hernias noted. Rectal:  No external abnormalities noted.  Normal sphincter tone. No rectal masses or tenderness. Genitalia:  Testes bilaterally descended without nodularity, tenderness or masses. No scrotal masses or lesions. No penis lesions or urethral discharge. Prostate:  Prostate gland firm and smooth, no enlargement, nodularity, tenderness, mass, asymmetry or induration. Msk:  No deformity or scoliosis noted of thoracic or lumbar spine.   Pulses:  R and L carotid,radial,femoral,dorsalis pedis and posterior tibial pulses are full and equal bilaterally Extremities:  No clubbing, cyanosis, edema, or deformity noted with normal full range of motion of all joints.   Neurologic:  No cranial nerve deficits noted. Station and gait are normal. Plantar reflexes are down-going bilaterally. DTRs are symmetrical throughout. Sensory, motor and coordinative functions appear intact. Skin:  Intact without suspicious lesions or rashes Cervical Nodes:  No lymphadenopathy noted Axillary Nodes:  No palpable lymphadenopathy Inguinal Nodes:  No significant adenopathy Psych:  Cognition and judgment appear intact. Alert and cooperative with normal attention span and concentration. 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 1 once daily  Orders: Prescription Created Electronically [phone])  Complete Medication List: 1)  Zyrtec Allergy 10 Mg Tabs (Cetirizine hcl) .Marland Kitchen.. 1 once daily 2)  Fish Oil 1400 Mg Caps (omega-3 Fatty Acids)  .... Take once  daily 3)  Daily Vitamins Tabs (Multiple vitamin) .... Once daily 4)  Mobic 15 Mg Tabs (Meloxicam) .... Once day  Patient Instructions: 1)  Please schedule a follow-up appointment in 1 year. Prescriptions: MOBIC 15 MG TABS (MELOXICAM) once day  #100 x 3   Entered and Authorized by:   Roderick Pee MD   Signed by:   Roderick Pee MD on 10/10/2009   Method used:   Electronically to        [facility]* (retail)       [address]       [place], [place]  [id number]       Ph: [phone]       Fax: [phone]   RxID:   [id number]    Immunization History:  Influenza Immunization History:    Influenza:  historical (11/01/2008)

## 2010-03-05 NOTE — Assessment & Plan Note (Signed)
Summary: GROIN AREA ISSUES//CCM   Vital Signs:  Patient profile:   60 year old male Height:      71 inches Weight:      197 pounds BMI:     27.58 Temp:     98.9 degrees F oral BP sitting:   150 / 100  (left arm) Cuff size:   regular  Vitals Entered By: Kern Reap CMA Duncan Dull) (December 20, 2008 3:15 PM)  Reason for Visit irritation in groin area  History of Present Illness: Jacob [name] is a 60 year old male, who comes in today for evaluation of a skin rash, and two bumps on his right and left arm.  The skin rashes been present for about two to 3 weeks.  It is pruritic.  He does not recall a trigger.  Allergies: No Known Drug Allergies  Past History:  Past medical, surgical, family and social histories (including risk factors) reviewed for relevance to current acute and chronic problems.  Past Medical History: Reviewed history from 03/28/2007 and no changes required. Allergic rhinitis Hyperlipidemia left knee scoped and 80 to 99 appendectomy tonsillectomy  Past Surgical History: Reviewed history from 08/15/2006 and no changes required. L knee scope x2  Appendectomy-73' Tonsillectomyand adnoids-55' cyst R pulm.  Family History: Reviewed history from 03/28/2007 and no changes required. father and glaucoma, and osteoarthritis mother died at 42 of sarcoidosis two brothers in good health two sisters one has hypertension  Social History: Reviewed history from 03/28/2007 and no changes required. Occupation: [employer] Married Never Smoked Alcohol use-no Drug use-no Regular exercise-yes  Review of Systems      See HPI  Physical Exam  General:  Well-developed,well-nourished,in no acute distress; alert,appropriate and cooperative throughout examination Skin:  the skin of the scrotum is red excoriated and swollen.  The two actinic keratoses on each forearm thighs to observe.  If the redness does not resolve in a couple weeks.  Return for removal   Impression &  Recommendations:  Problem # 1:  CONTACT DERMATITIS&OTHER ECZEMA DUE UNSPEC CAUSE (ICD-692.9) Assessment New  His updated medication list for this problem includes:    Zyrtec Allergy 10 Mg Tabs (Cetirizine hcl) .Marland Kitchen... 1 once daily    Fluocinonide 0.05 % Oint (Fluocinonide) .Marland Kitchen... Apply two times a day  Orders: Prescription Created Electronically [phone])  Complete Medication List: 1)  Zyrtec Allergy 10 Mg Tabs (Cetirizine hcl) .Marland Kitchen.. 1 once daily 2)  Fish Oil 1000 Mg Caps (Omega-3 fatty acids) .... Take two tabs once daily 3)  Daily Vitamins Tabs (Multiple vitamin) .... Once daily 4)  Fluocinonide 0.05 % Oint (Fluocinonide) .... Apply two times a day  Patient Instructions: 1)  applied the steroid ointment, small amounts twice a day until the rash goes away.  Return p.r.n.Marland Kitchen 2)  If the actinic keratoses lesions on your right arm continued to stay  irritated and red after two to 3 weeks.  Return for removal Prescriptions: FLUOCINONIDE 0.05 % OINT (FLUOCINONIDE) apply two times a day  #60 gr x 1   Entered and Authorized by:   Jacob Pee MD   Signed by:   Jacob Pee MD on 12/20/2008   Method used:   Electronically to        [facility]* (retail)       [address]       [place], [place]  [id number]       Ph: [phone]       Fax: [phone]   RxID:   [id number]

## 2010-03-05 NOTE — Miscellaneous (Signed)
  Clinical Lists Changes  Medications: Added new medication of SIMVASTATIN 40 MG  TABS (SIMVASTATIN) 1 at bedtime Added new medication of ZYRTEC ALLERGY 10 MG  TABS (CETIRIZINE HCL) 1 once daily Added new medication of OFLOXACIN 0.3 % OPHTH SOLN (OFLOXACIN) uad

## 2010-03-05 NOTE — Progress Notes (Signed)
  Phone Note Call from Patient   Caller: Patient @ [phone] Reason for Call: Talk to Nurse, Talk to Doctor Summary of Call: Pt called req to speak with Dr. Tawanna Cooler  to advise that while his contact dermatitis is better, he thinks that he may have developed an itchy rash from the medicine (pt denies any other symptoms of an allergic reaction)??.... Pt req a call back from Dr. Tawanna Cooler or his nurse Fleet Contras) to further discuss this problem. Initial call taken by: Debbra Riding,  December 24, 2008 5:08 PM  Follow-up for Phone Call        Fleet Contras please call Follow-up by: Roderick Pee MD,  December 25, 2008 7:48 AM  Additional Follow-up for Phone Call Additional follow up Details #1::        Phone Call Completed Additional Follow-up by: Kern Reap CMA Duncan Dull),  December 25, 2008 8:15 AM

## 2010-03-05 NOTE — Assessment & Plan Note (Signed)
Summary: 2 WEEK FUP//CCM   Vital Signs:  Patient profile:   60 year old male Weight:      202 pounds Temp:     97.8 degrees F oral Pulse rate:   62 / minute Pulse rhythm:   regular Resp:     12 per minute BP sitting:   118 / 90  (left arm) Cuff size:   regular  Vitals Entered By: Gladis Riffle, RN (April 01, 2009 2:29 PM) CC: 2 week rov--only has 1/2 prednisone left to take and has quit flexeril Is Patient Diabetic? No   CC:  2 week rov--only has 1/2 prednisone left to take and has quit flexeril.  History of Present Illness: Jacob [name] is a 60 year old male, who comes in today for reevaluation of neck pain.  We saw him a couple weeks ago with severe neck pain.  He was neurologically intact except he has some tingling which has since resolved.  We start him on prednisone, Vicodin, Flexeril, soft cervical collar, and PT.  He says his pain is 50% decreased.  The weekend.  He played golf on Saturday and did 4 hours of yard work on Sunday.  He is off his Flexeril will take a half of Vicodin at bedtime as needed and his last dose of prednisone one half tablet is tomorrow.  Neurologic review of systems negative  Preventive Screening-Counseling & Management  Alcohol-Tobacco     Smoking Status: never  Current Medications (verified): 1)  Zyrtec Allergy 10 Mg  Tabs (Cetirizine Hcl) .Marland Kitchen.. 1 Once Daily 2)  Fish Oil 1000 Mg Caps (Omega-3 Fatty Acids) .... Take Two Tabs Once Daily 3)  Daily Vitamins  Tabs (Multiple Vitamin) .... Once Daily 4)  Vicodin Es 7.5-750 Mg Tabs (Hydrocodone-Acetaminophen) .Marland Kitchen.. 1 Tab @ Bedtime 5)  Prednisone 20 Mg Tabs (Prednisone) .... Uad  Allergies (verified): No Known Drug Allergies  Past History:  Past medical, surgical, family and social histories (including risk factors) reviewed for relevance to current acute and chronic problems.  Past Medical History: Reviewed history from 03/28/2007 and no changes required. Allergic rhinitis Hyperlipidemia left knee scoped  and 80 to 99 appendectomy tonsillectomy  Past Surgical History: Reviewed history from 08/15/2006 and no changes required. L knee scope x2  Appendectomy-73' Tonsillectomyand adnoids-55' cyst R pulm.  Family History: Reviewed history from 03/28/2007 and no changes required. father and glaucoma, and osteoarthritis mother died at 78 of sarcoidosis two brothers in good health two sisters one has hypertension  Social History: Reviewed history from 03/28/2007 and no changes required. Occupation: [employer] Married Never Smoked Alcohol use-no Drug use-no Regular exercise-yes  Review of Systems      See HPI  Physical Exam  General:  Well-developed,well-nourished,in no acute distress; alert,appropriate and cooperative throughout examination Msk:  No deformity or scoliosis noted of thoracic or lumbar spine.   Pulses:  R and L carotid,radial,femoral,dorsalis pedis and posterior tibial pulses are full and equal bilaterally Extremities:  No clubbing, cyanosis, edema, or deformity noted with normal full range of motion of all joints.   Neurologic:  No cranial nerve deficits noted. Station and gait are normal. Plantar reflexes are down-going bilaterally. DTRs are symmetrical throughout. Sensory, motor and coordinative functions appear intact.   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 1 once daily 2)  Fish Oil 1000 Mg Caps (Omega-3 fatty acids) .... Take  two tabs once daily 3)  Daily Vitamins Tabs (Multiple vitamin) .... Once daily 4)  Vicodin Es 7.5-750 Mg Tabs (Hydrocodone-acetaminophen) .Marland Kitchen.. 1 tab @ bedtime 5)  Prednisone 20 Mg Tabs (Prednisone) .... Uad  Patient Instructions: 1)  finish the prednisone.........Marland Kitchen begin Motrin 600 mg twice a day with food until your pain free...  Continue to wear the soft  cervical collar at  bedtime 2)  Also continue  The PT..........Marland Kitchen return p.r.n.

## 2010-06-16 NOTE — Assessment & Plan Note (Signed)
[facility]                                 ON-CALL NOTE   NAME:Branaman, BRAEDAN                         MRN:          [id number]  DATE:08/11/2006                            DOB:          20-Dec-1950    DATE OF INTERACTION:  August 11, 2006 at 6:53 p.m.   PHONE NUMBER:  Is [phone].   OBJECTIVE:  The patient was seen this morning for an allergic reaction,  put on prednisone, now has a temperature to 99, wants to know if he can  take something for it.   IMPRESSION:  Elevated temperature.   PLAN:  If he wants to take anything, would take Tylenol per label,  suggest he try and leave the fever alone as it will help fight off  things, but if he so desires he may take Tylenol.   PRIMARY CARE Shaivi Rothschild:  Eugenio Hoes. Tawanna Cooler, M.D.   HOME OFFICE:  [facility].     Arta Silence, MD  Electronically Signed    RNS/MedQ  DD: 08/11/2006  DT: 08/12/2006  Job #: [id number]

## 2010-06-19 NOTE — Op Note (Signed)
[place]. [facility]  Patient:    Jacob [name], Jacob [name]                         MRN: [id number] Proc. Date: 01/19/00 Adm. Date:  [id number] Attending:  Susa Day CC:         Teena Irani. Arlyce Dice, M.D.   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  OPERATION PERFORMED:  SURGEON:  Katy Fitch. Sypher, Montez Hageman., M.D.  ASSISTANT:  Jonni Sanger, P.A.  ANESTHESIA:  0.25% Marcaine and 2% lidocaine metacarpal head level block, right long finger supplemented by IV sedation.  SUPERVISING ANESTHESIOLOGIST:  Dr. Ivin Booty.  INDICATIONS FOR PROCEDURE:  Jacob [name] is a 60 year old who presented for evaluation of a mass in his long finger proximal phalangeal segment on the right.  Clinical examination revealed a typical A-2 pulley ganglion that was about 1 cm in diameter.  This was causing grasp impairment and discomfort.  He requested excision. After informed consent he is brought to the operating room at this time.  DESCRIPTION OF PROCEDURE:  Jacob [name] was brought to the operating room and placed in supine position on the operating table.  Following induction of light sedation, the right arm was prepped with Betadine soap and solution and sterilely draped.  0.25% lidocaine infiltrated at the metacarpal head level to obtain a digital block.  When anesthesia was satisfactory, the procedure commenced with a short oblique incision directly over the mass.  Subcutaneous tissues were carefully divided taking care to identify the radial and ulnar neurovascular bundles.  These were gently retracted with blunt retractors.  A sessile ganglion was noted at the A-2 pulley.  This was removed with a scissors dissection and a rongeur.  The A-2 pulley was cleaned of all pathologic appearing tissues and several blood vessels were electrocauterized with bipolar  current.  The wound was then repaired with interrupted sutures of 5-0 nylon.  A compressive dressing was applied with Xeroflo, sterile gauze and Coban.  There were no apparent complications.  The patient was transferred to the recovery room with stable vital signs. D:  01/19/00 TD:  01/20/00 Job: [id number]

## 2010-06-19 NOTE — Op Note (Signed)
Jacob [name], Jacob [name]                            ACCOUNT NO.:  [id number]   MEDICAL RECORD NO.:  [id number]                   PATIENT TYPE:  AMB   LOCATION:  ENDO                                 FACILITY:  [facility]   PHYSICIAN:  Anselmo Rod, M.D.               DATE OF BIRTH:  25-Dec-1950   DATE OF PROCEDURE:  10/22/2002  DATE OF DISCHARGE:                                 OPERATIVE REPORT   PROCEDURE PERFORMED:  Screening colonoscopy.   ENDOSCOPIST:  Anselmo Rod, M.D.   INSTRUMENT USED:  Olympus video colonoscope.   INDICATION FOR PROCEDURE:  A 60 year old white male undergoing screening  colonoscopy.  The patient denies history of rectal bleeding.  There is a  family history of polyps in his father.  Rule out polyps, masses, etc.   PREPROCEDURE PREPARATION:  Informed consent was procured from the patient.  The patient had fasted for eight hours prior to the procedure and prepped  with a bottle of magnesium citrate and a gallon of GoLYTELY the night prior  to the procedure.   PREPROCEDURE PHYSICAL:  VITAL SIGNS:  The patient had stable vital signs.  NECK:  Supple.  CHEST:  Clear to auscultation.  S1, S2 regular.  ABDOMEN:  Soft with normal bowel sounds.   DESCRIPTION OF PROCEDURE:  The patient was placed in the left lateral  decubitus position and sedated with 80 mg of Demerol and 8 mg of Versed  intravenously.  Once the patient was adequately sedate and maintained on low-  flow oxygen and continuous cardiac monitoring, the Olympus video colonoscope  was advanced from the rectum to the cecum and terminal ileum without  difficulty.  There was evidence of sigmoid diverticulosis with small  internal hemorrhoids.  No masses or polyps were seen.  The appendiceal  orifice and the ileocecal valve were clearly visualized and photographed.  The terminal ileum appeared normal.   IMPRESSION:  1. Small, nonbleeding internal hemorrhoids seen on retroflexion.  2. Sigmoid  diverticulosis.  3. No masses or polyps seen.  4. Normal terminal ileum.    RECOMMENDATIONS:  1. A high-fiber diet with liberal fluid intake has been recommended, 20-25 g     of fiber in the diet has been advised.  2. Repeat CRC screening is recommended in the next 10 years unless the     patient develops any abnormal symptoms in the interim.                                               Anselmo Rod, M.D.    JNM/MEDQ  D:  10/22/2002  T:  10/22/2002  Job:  [id number]   cc:   Teena Irani. Arlyce Dice, M.D.  [address]  [place]  [place] [address]  Fax: [phone]

## 2011-09-13 ENCOUNTER — Other Ambulatory Visit: Payer: Self-pay

## 2011-09-20 ENCOUNTER — Encounter: Payer: Self-pay | Admitting: Family Medicine

## 2011-09-20 ENCOUNTER — Other Ambulatory Visit: Payer: Self-pay

## 2011-09-20 ENCOUNTER — Ambulatory Visit (INDEPENDENT_AMBULATORY_CARE_PROVIDER_SITE_OTHER): Payer: BC Managed Care – PPO | Admitting: Family Medicine

## 2011-09-20 VITALS — BP 126/80 | HR 58 | Temp 98.4°F | Wt 195.0 lb

## 2011-09-20 DIAGNOSIS — Z1389 Encounter for screening for other disorder: Secondary | ICD-10-CM

## 2011-09-20 DIAGNOSIS — S93609A Unspecified sprain of unspecified foot, initial encounter: Secondary | ICD-10-CM

## 2011-09-20 DIAGNOSIS — Z0000 Encounter for general adult medical examination without abnormal findings: Secondary | ICD-10-CM

## 2011-09-20 DIAGNOSIS — S96919A Strain of unspecified muscle and tendon at ankle and foot level, unspecified foot, initial encounter: Secondary | ICD-10-CM

## 2011-09-20 LAB — BASIC METABOLIC PANEL
BUN: 15 mg/dL (ref 6–23)
CO2: 29 mEq/L (ref 19–32)
Calcium: 9.3 mg/dL (ref 8.4–10.5)
Chloride: 107 mEq/L (ref 96–112)
Creatinine, Ser: 0.9 mg/dL (ref 0.4–1.5)
GFR: 86.14 mL/min (ref >60.00)
Glucose, Bld: 95 mg/dL (ref 70–99)
Potassium: 4.4 mEq/L (ref 3.5–5.1)
Sodium: 142 mEq/L (ref 135–145)

## 2011-09-20 LAB — CBC WITH DIFFERENTIAL/PLATELET
Basophils Absolute: 0 10*3/uL (ref 0.0–0.1)
Basophils Relative: 0.8 % (ref 0.0–3.0)
Eosinophils Absolute: 0.1 10*3/uL (ref 0.0–0.7)
Eosinophils Relative: 1.4 % (ref 0.0–5.0)
HCT: 43.8 % (ref 39.0–52.0)
Hemoglobin: 14.5 g/dL (ref 13.0–17.0)
Lymphocytes Relative: 28.5 % (ref 12.0–46.0)
Lymphs Abs: 1.5 10*3/uL (ref 0.7–4.0)
MCHC: 33.1 g/dL (ref 30.0–36.0)
MCV: 91.8 fl (ref 78.0–100.0)
Monocytes Absolute: 0.5 10*3/uL (ref 0.1–1.0)
Monocytes Relative: 8.9 % (ref 3.0–12.0)
Neutro Abs: 3.1 10*3/uL (ref 1.4–7.7)
Neutrophils Relative %: 60.4 % (ref 43.0–77.0)
Platelets: 193 10*3/uL (ref 150.0–400.0)
RBC: 4.77 Mil/uL (ref 4.22–5.81)
RDW: 13.5 % (ref 11.5–14.6)
WBC: 5.2 10*3/uL (ref 4.5–10.5)

## 2011-09-20 LAB — POCT URINALYSIS DIPSTICK
Bilirubin, UA: NEGATIVE
Blood, UA: NEGATIVE
Glucose, UA: NEGATIVE
Ketones, UA: NEGATIVE
Leukocytes, UA: NEGATIVE
Nitrite, UA: NEGATIVE
Protein, UA: 5.5
Spec Grav, UA: 1.015
Urobilinogen, UA: 0.2
pH, UA: 5.5

## 2011-09-20 LAB — TSH: TSH: 1.28 u[IU]/mL (ref 0.35–5.50)

## 2011-09-20 LAB — LIPID PANEL
Cholesterol: 203 mg/dL — ABNORMAL HIGH (ref 0–200)
HDL: 52 mg/dL (ref >39.00)
Total CHOL/HDL Ratio: 4
Triglycerides: 107 mg/dL (ref 0.0–149.0)
VLDL: 21.4 mg/dL (ref 0.0–40.0)

## 2011-09-20 LAB — HEPATIC FUNCTION PANEL
ALT: 16 U/L (ref 0–53)
AST: 19 U/L (ref 0–37)
Albumin: 4.2 g/dL (ref 3.5–5.2)
Alkaline Phosphatase: 46 U/L (ref 39–117)
Bilirubin, Direct: 0.1 mg/dL (ref 0.0–0.3)
Total Bilirubin: 0.5 mg/dL (ref 0.3–1.2)
Total Protein: 6.6 g/dL (ref 6.0–8.3)

## 2011-09-20 LAB — LDL CHOLESTEROL, DIRECT: Direct LDL: 133.4 mg/dL

## 2011-09-20 LAB — PSA: PSA: 1.97 ng/mL (ref 0.10–4.00)

## 2011-09-20 LAB — URIC ACID: Uric Acid, Serum: 7.3 mg/dL (ref 4.0–7.8)

## 2011-09-20 NOTE — Progress Notes (Signed)
  Subjective:    Patient ID: Jacob [name], male    DOB: [birth year], 61 y.o.   MRN: [id number]  HPI Here for 3 days of pain in the left great toe. No recent trauma but he did a lot of walking on it on a golf [place] this past weekend, both playing and watching a tournament. He was wearing a new pair of golf shoes that are very light and provide little support. The toe was never red or hot or swollen. Using Ibuprofen    Review of Systems  Constitutional: Negative.   Musculoskeletal: Positive for arthralgias.       Objective:   Physical Exam  Constitutional: He appears well-developed and well-nourished.       Walks with a slight limp   Musculoskeletal:       Tender at the inferior surface of the left great MTP. No erythema or swelling. Not warm. His arches are quite flat          Assessment & Plan:  This is a strain to the bottom of the foot from having flat arches and wearing shoes with little support. Rest, use Advil prn. Use hot tub soaks. Recheck prn

## 2011-09-21 ENCOUNTER — Ambulatory Visit: Payer: Self-pay | Admitting: Family Medicine

## 2011-09-23 NOTE — Progress Notes (Signed)
Quick Note:  I left voice message with results. ______ 

## 2011-10-05 ENCOUNTER — Encounter: Payer: Self-pay | Admitting: Family Medicine

## 2011-10-12 ENCOUNTER — Ambulatory Visit (INDEPENDENT_AMBULATORY_CARE_PROVIDER_SITE_OTHER): Payer: BC Managed Care – PPO | Admitting: Family Medicine

## 2011-10-12 ENCOUNTER — Encounter: Payer: Self-pay | Admitting: Family Medicine

## 2011-10-12 VITALS — BP 130/80 | Temp 97.9°F | Ht 70.5 in | Wt 191.0 lb

## 2011-10-12 DIAGNOSIS — Z23 Encounter for immunization: Secondary | ICD-10-CM

## 2011-10-12 DIAGNOSIS — Z0000 Encounter for general adult medical examination without abnormal findings: Secondary | ICD-10-CM

## 2011-10-12 NOTE — Patient Instructions (Signed)
Continue your good health habits  Return yearly for general physical examination 

## 2011-10-12 NOTE — Progress Notes (Signed)
  Subjective:    Patient ID: Jacob [name], male    DOB: 07-Feb-1950, 61 y.o.   MRN: [id number]  HPI Al 61 year old married male nonsmoker who retired from work a year ago who comes in today for general physical examination  He states he's had a good year no problems he's lost 10 pounds with diet and exercise. He did have surgery on his left shoulder by Dr. supple  He gets routine eye care, dental care, colonoscopy normal, tetanus 2004, seasonal flu shot today, he did have the disease of shingles and declines a vaccination   Review of Systems  Constitutional: Negative.   HENT: Negative.   Eyes: Negative.   Respiratory: Negative.   Cardiovascular: Negative.   Gastrointestinal: Negative.   Genitourinary: Negative.   Musculoskeletal: Negative.   Skin: Negative.   Neurological: Negative.   Hematological: Negative.   Psychiatric/Behavioral: Negative.        Objective:   Physical Exam  Constitutional: He is oriented to person, place, and time. He appears well-developed and well-nourished.  HENT:  Head: Normocephalic and atraumatic.  Right Ear: External ear normal.  Left Ear: External ear normal.  Nose: Nose normal.  Mouth/Throat: Oropharynx is clear and moist.  Eyes: Conjunctivae and EOM are normal. Pupils are equal, round, and reactive to light.  Neck: Normal range of motion. Neck supple. No JVD present. No tracheal deviation present. No thyromegaly present.  Cardiovascular: Normal rate, regular rhythm, normal heart sounds and intact distal pulses.  Exam reveals no gallop and no friction rub.   No murmur heard. Pulmonary/Chest: Effort normal and breath sounds normal. No stridor. No respiratory distress. He has no wheezes. He has no rales. He exhibits no tenderness.  Abdominal: Soft. Bowel sounds are normal. He exhibits no distension and no mass. There is no tenderness. There is no rebound and no guarding.  Genitourinary: Rectum normal, prostate normal and penis normal. Guaiac  negative stool. No penile tenderness.  Musculoskeletal: Normal range of motion. He exhibits no edema and no tenderness.  Lymphadenopathy:    He has no cervical adenopathy.  Neurological: He is alert and oriented to person, place, and time. He has normal reflexes. No cranial nerve deficit. He exhibits normal muscle tone.  Skin: Skin is warm and dry. No rash noted. No erythema. No pallor.       Total body skin exam normal  Psychiatric: He has a normal mood and affect. His behavior is normal. Judgment and thought content normal.          Assessment & Plan:  Healthy male return yearly for general physical examination

## 2012-02-02 LAB — HM COLONOSCOPY: HM Colonoscopy: NORMAL

## 2013-02-12 ENCOUNTER — Encounter: Payer: Self-pay | Admitting: Family Medicine

## 2013-02-12 ENCOUNTER — Ambulatory Visit (INDEPENDENT_AMBULATORY_CARE_PROVIDER_SITE_OTHER): Payer: BC Managed Care – PPO | Admitting: *Deleted

## 2013-02-12 DIAGNOSIS — Z23 Encounter for immunization: Secondary | ICD-10-CM

## 2013-02-20 ENCOUNTER — Telehealth: Payer: Self-pay | Admitting: Family Medicine

## 2013-02-20 NOTE — Telephone Encounter (Signed)
Patient Information:  Caller Name: Curren  Phone: [phone]  Patient: Jacob [name], Jacob [name]  Gender: Male  DOB: [birth year]  Age: 63 Years  PCP: Stevie Kern [facility])  Office Follow Up:  Does the office need to follow up with this patient?: No  Instructions For The Office: N/A  RN Note:  Care advice and call back parameters reviewed. Advised to closely mointor for return of s/sx and call back for questions, changes or concerns. Understanding expressed  Symptoms  Reason For Call & Symptoms: Patient states he began running low grade fever 99.5 (o)  and scratchy throat yesterday after lunch (02/19/13.) Temperature currently- 98.3 (o) .   Today, he noted a  rash on top of both feet-  blotchy patches, white/red in color , itchy.  The rash is now gone.  No other areas of concern.  His throat is not sore or scratchy at present.  Reviewed Health History In EMR: Yes  Reviewed Medications In EMR: Yes  Reviewed Allergies In EMR: Yes  Reviewed Surgeries / Procedures: Yes  Date of Onset of Symptoms: 02/19/2013  Treatments Tried: push fluids  Treatments Tried Worked: No  Guideline(s) Used:  Rash or Redness - Localized  Disposition Per Guideline:   Home Care  Reason For Disposition Reached:   Mild localized rash  Advice Given:  Avoid the Cause:   Try to find the cause. Consider irritants like a plant (e.g., poison ivy or evergreens), chemicals (e.g., solvents or insecticides), fiberglass, a new cosmetic, or new jewelry (called contact dermatitis). A pet may be carrying the irritating substance (e.g., with poison ivy or poison oak).  Avoid Soap:  Wash the area once thoroughly with soap to remove any remaining irritants. Thereafter avoid soaps to this area. Cleanse the area when needed with warm water.  Hydrocortisone Cream for Itching:   Keep the cream in the refrigerator (Reason: it feels better if applied cold).  Available over-the-counter in [place] as 0.5% and 1% cream.  Call  Back If:  Rash spreads or becomes worse  You become worse.  Patient Will Follow Care Advice:  YES

## 2013-02-27 ENCOUNTER — Encounter: Payer: Self-pay | Admitting: Family Medicine

## 2013-02-27 ENCOUNTER — Ambulatory Visit (INDEPENDENT_AMBULATORY_CARE_PROVIDER_SITE_OTHER): Payer: BC Managed Care – PPO | Admitting: Family Medicine

## 2013-02-27 VITALS — BP 124/80 | Temp 97.9°F | Wt 190.0 lb

## 2013-02-27 DIAGNOSIS — J069 Acute upper respiratory infection, unspecified: Secondary | ICD-10-CM

## 2013-02-27 DIAGNOSIS — J309 Allergic rhinitis, unspecified: Secondary | ICD-10-CM

## 2013-02-27 MED ORDER — PREDNISONE 20 MG PO TABS: ORAL_TABLET | ORAL | Status: DC

## 2013-02-27 MED ORDER — AMOXICILLIN 500 MG PO CAPS: 500.0000 mg | ORAL_CAPSULE | Freq: Three times a day (TID) | ORAL | Status: DC

## 2013-02-27 NOTE — Progress Notes (Signed)
Pre visit review using our clinic review tool, if applicable. No additional management support is needed unless otherwise documented below in the visit note. 

## 2013-02-27 NOTE — Patient Instructions (Signed)
Drink lots of water  Vaporizer in your bedroom at night  Afrin nasal spray,,,,,,,, 1 shot up each nostril at bedtime for 5 nights  Begin the prednisone as directed  If after week he don't see any improvement then add the amoxicillin

## 2013-02-27 NOTE — Progress Notes (Signed)
   Subjective:    Patient ID: Jacob [name], male    DOB: 1950/11/09, 63 y.o.   MRN: [id number]  HPI Jacob [name] is a 63 year old married male nonsmoker who comes in today with a one-week history of head congestion postnasal drip  He has perennial allergic rhinitis and takes over-the-counter Zyrtec and a steroid nasal spray. He stopped the steroid nasal spray about 10 days ago because he started having nosebleeds. He's only taken a Zyrtec once a day bedtime. His grandkids were here a couple weeks prior to his symptoms. He has no fever cough etc. His drainage is green in color   Review of Systems    review of systems negative Objective:   Physical Exam  Well-developed and nourished male no acute distress vital signs stable he is afebrile ENT negative except for septal deviation to the left. 3 post turbinate edema neck was supple no adenopathy lungs are clear      Assessment & Plan:  Viral syndrome and is status of underlying allergic rhinitis plan treat symptomatically

## 2013-04-11 ENCOUNTER — Other Ambulatory Visit (INDEPENDENT_AMBULATORY_CARE_PROVIDER_SITE_OTHER): Payer: BC Managed Care – PPO

## 2013-04-11 DIAGNOSIS — Z0000 Encounter for general adult medical examination without abnormal findings: Secondary | ICD-10-CM

## 2013-04-11 LAB — CBC WITH DIFFERENTIAL/PLATELET
Basophils Absolute: 0.1 10*3/uL (ref 0.0–0.1)
Basophils Relative: 0.9 % (ref 0.0–3.0)
Eosinophils Absolute: 0.1 10*3/uL (ref 0.0–0.7)
Eosinophils Relative: 2 % (ref 0.0–5.0)
HCT: 43.2 % (ref 39.0–52.0)
Hemoglobin: 14.7 g/dL (ref 13.0–17.0)
Lymphocytes Relative: 25.9 % (ref 12.0–46.0)
Lymphs Abs: 1.7 10*3/uL (ref 0.7–4.0)
MCHC: 34.2 g/dL (ref 30.0–36.0)
MCV: 91.5 fl (ref 78.0–100.0)
Monocytes Absolute: 0.5 10*3/uL (ref 0.1–1.0)
Monocytes Relative: 7 % (ref 3.0–12.0)
Neutro Abs: 4.1 10*3/uL (ref 1.4–7.7)
Neutrophils Relative %: 64.2 % (ref 43.0–77.0)
Platelets: 230 10*3/uL (ref 150.0–400.0)
RBC: 4.72 Mil/uL (ref 4.22–5.81)
RDW: 13.5 % (ref 11.5–14.6)
WBC: 6.4 10*3/uL (ref 4.5–10.5)

## 2013-04-11 LAB — POCT URINALYSIS DIPSTICK
Bilirubin, UA: NEGATIVE
Blood, UA: NEGATIVE
Glucose, UA: NEGATIVE
Ketones, UA: NEGATIVE
Leukocytes, UA: NEGATIVE
Nitrite, UA: NEGATIVE
Protein, UA: NEGATIVE
Spec Grav, UA: 1.015
Urobilinogen, UA: 0.2
pH, UA: 6

## 2013-04-11 LAB — BASIC METABOLIC PANEL
BUN: 17 mg/dL (ref 6–23)
CO2: 26 mEq/L (ref 19–32)
Calcium: 9.4 mg/dL (ref 8.4–10.5)
Chloride: 103 mEq/L (ref 96–112)
Creatinine, Ser: 1.2 mg/dL (ref 0.4–1.5)
GFR: 66.58 mL/min (ref >60.00)
Glucose, Bld: 96 mg/dL (ref 70–99)
Potassium: 4.8 mEq/L (ref 3.5–5.1)
Sodium: 138 mEq/L (ref 135–145)

## 2013-04-11 LAB — HEPATIC FUNCTION PANEL
ALT: 21 U/L (ref 0–53)
AST: 22 U/L (ref 0–37)
Albumin: 4.4 g/dL (ref 3.5–5.2)
Alkaline Phosphatase: 42 U/L (ref 39–117)
Bilirubin, Direct: 0.1 mg/dL (ref 0.0–0.3)
Total Bilirubin: 0.6 mg/dL (ref 0.3–1.2)
Total Protein: 6.5 g/dL (ref 6.0–8.3)

## 2013-04-11 LAB — LIPID PANEL
Cholesterol: 191 mg/dL (ref 0–200)
HDL: 64.5 mg/dL (ref >39.00)
LDL Cholesterol: 118 mg/dL — ABNORMAL HIGH (ref 0–99)
Total CHOL/HDL Ratio: 3
Triglycerides: 41 mg/dL (ref 0.0–149.0)
VLDL: 8.2 mg/dL (ref 0.0–40.0)

## 2013-04-11 LAB — PSA: PSA: 4.55 ng/mL — ABNORMAL HIGH (ref 0.10–4.00)

## 2013-04-11 LAB — TSH: TSH: 1.4 u[IU]/mL (ref 0.35–5.50)

## 2013-04-17 ENCOUNTER — Ambulatory Visit (INDEPENDENT_AMBULATORY_CARE_PROVIDER_SITE_OTHER): Payer: BC Managed Care – PPO | Admitting: Family Medicine

## 2013-04-17 ENCOUNTER — Encounter: Payer: Self-pay | Admitting: Family Medicine

## 2013-04-17 VITALS — BP 120/80 | Temp 98.3°F | Ht 70.25 in | Wt 184.0 lb

## 2013-04-17 DIAGNOSIS — J309 Allergic rhinitis, unspecified: Secondary | ICD-10-CM

## 2013-04-17 DIAGNOSIS — Z23 Encounter for immunization: Secondary | ICD-10-CM

## 2013-04-17 DIAGNOSIS — R972 Elevated prostate specific antigen [PSA]: Secondary | ICD-10-CM

## 2013-04-17 DIAGNOSIS — Z0000 Encounter for general adult medical examination without abnormal findings: Secondary | ICD-10-CM

## 2013-04-17 NOTE — Progress Notes (Signed)
   Subjective:    Patient ID: Jacob [name], male    DOB: 1950/09/20, 63 y.o.   MRN: [id number]  HPI [name] is a 63 year old married male nonsmoker who comes in today for general physical examination  He takes no medicines for any chronic diseases except for Allegra or Claritin or Zyrtec for allergic rhinitis  He gets routine eye care, dental care, recent colonoscopy 2014 normal, tetanus booster today information given on Pneumovax and shingles.   Review of Systems  Constitutional: Negative.   HENT: Negative.   Eyes: Negative.   Respiratory: Negative.   Cardiovascular: Negative.   Gastrointestinal: Negative.   Endocrine: Negative.   Genitourinary: Negative.   Musculoskeletal: Negative.   Skin: Negative.   Allergic/Immunologic: Negative.   Neurological: Negative.   Hematological: Negative.   Psychiatric/Behavioral: Negative.        Objective:   Physical Exam  Nursing note and vitals reviewed. Constitutional: He is oriented to person, place, and time. He appears well-developed and well-nourished.  HENT:  Head: Normocephalic and atraumatic.  Right Ear: External ear normal.  Left Ear: External ear normal.  Nose: Nose normal.  Mouth/Throat: Oropharynx is clear and moist.  Eyes: Conjunctivae and EOM are normal. Pupils are equal, round, and reactive to light.  Neck: Normal range of motion. Neck supple. No JVD present. No tracheal deviation present. No thyromegaly present.  Cardiovascular: Normal rate, regular rhythm, normal heart sounds and intact distal pulses.  Exam reveals no gallop and no friction rub.   No murmur heard. No carotid nor aortic bruits peripheral pulses 2+ and symmetrical  Pulmonary/Chest: Effort normal and breath sounds normal. No stridor. No respiratory distress. He has no wheezes. He has no rales. He exhibits no tenderness.  Abdominal: Soft. Bowel sounds are normal. He exhibits no distension and no mass. There is no tenderness. There is no rebound and no  guarding.  Genitourinary: Rectum normal, prostate normal and penis normal. Guaiac negative stool. No penile tenderness.  Musculoskeletal: Normal range of motion. He exhibits no edema and no tenderness.  Lymphadenopathy:    He has no cervical adenopathy.  Neurological: He is alert and oriented to person, place, and time. He has normal reflexes. No cranial nerve deficit. He exhibits normal muscle tone.  Skin: Skin is warm and dry. No rash noted. No erythema. No pallor.  Total body skin exam normal. He states he sees [name] for skin check yearly. He's had some dysplastic nevi on his face  Psychiatric: He has a normal mood and affect. His behavior is normal. Judgment and thought content normal.          Assessment & Plan:  Allergic rhinitis continue OTC antihistamines return in one year for general medical exam sooner if any problems  Elevated PSA with normal exam........Marland Kitchen repeat PSA in 4 weeks

## 2013-04-17 NOTE — Progress Notes (Signed)
Pre visit review using our clinic review tool, if applicable. No additional management support is needed unless otherwise documented below in the visit note. 

## 2013-04-17 NOTE — Patient Instructions (Addendum)
Continue your good health habits. Return in one year for general physical exam sooner if any problem  Return in 4 weeks for a nonfasting followup PSA level

## 2013-05-10 ENCOUNTER — Other Ambulatory Visit (INDEPENDENT_AMBULATORY_CARE_PROVIDER_SITE_OTHER): Payer: BC Managed Care – PPO

## 2013-05-10 DIAGNOSIS — R972 Elevated prostate specific antigen [PSA]: Secondary | ICD-10-CM

## 2013-05-10 LAB — PSA: PSA: 1.53 ng/mL (ref 0.10–4.00)

## 2013-11-21 DIAGNOSIS — M7701 Medial epicondylitis, right elbow: Secondary | ICD-10-CM | POA: Diagnosis not present

## 2013-11-21 DIAGNOSIS — M25521 Pain in right elbow: Secondary | ICD-10-CM | POA: Diagnosis not present

## 2014-02-07 DIAGNOSIS — M25521 Pain in right elbow: Secondary | ICD-10-CM | POA: Diagnosis not present

## 2014-02-07 DIAGNOSIS — M7701 Medial epicondylitis, right elbow: Secondary | ICD-10-CM | POA: Diagnosis not present

## 2014-02-13 DIAGNOSIS — M7701 Medial epicondylitis, right elbow: Secondary | ICD-10-CM | POA: Diagnosis not present

## 2014-02-25 DIAGNOSIS — M7701 Medial epicondylitis, right elbow: Secondary | ICD-10-CM | POA: Diagnosis not present

## 2014-02-28 DIAGNOSIS — M7701 Medial epicondylitis, right elbow: Secondary | ICD-10-CM | POA: Diagnosis not present

## 2014-03-04 DIAGNOSIS — M7701 Medial epicondylitis, right elbow: Secondary | ICD-10-CM | POA: Diagnosis not present

## 2014-03-07 DIAGNOSIS — M25521 Pain in right elbow: Secondary | ICD-10-CM | POA: Diagnosis not present

## 2014-03-07 DIAGNOSIS — M7711 Lateral epicondylitis, right elbow: Secondary | ICD-10-CM | POA: Diagnosis not present

## 2014-03-07 DIAGNOSIS — G5621 Lesion of ulnar nerve, right upper limb: Secondary | ICD-10-CM | POA: Diagnosis not present

## 2014-06-19 DIAGNOSIS — M7711 Lateral epicondylitis, right elbow: Secondary | ICD-10-CM | POA: Diagnosis not present

## 2014-06-19 DIAGNOSIS — M7702 Medial epicondylitis, left elbow: Secondary | ICD-10-CM | POA: Diagnosis not present

## 2014-06-21 DIAGNOSIS — M7541 Impingement syndrome of right shoulder: Secondary | ICD-10-CM | POA: Diagnosis not present

## 2014-06-25 DIAGNOSIS — M7541 Impingement syndrome of right shoulder: Secondary | ICD-10-CM | POA: Diagnosis not present

## 2014-06-27 DIAGNOSIS — M7541 Impingement syndrome of right shoulder: Secondary | ICD-10-CM | POA: Diagnosis not present

## 2014-07-02 DIAGNOSIS — M7541 Impingement syndrome of right shoulder: Secondary | ICD-10-CM | POA: Diagnosis not present

## 2014-07-04 DIAGNOSIS — M7541 Impingement syndrome of right shoulder: Secondary | ICD-10-CM | POA: Diagnosis not present

## 2014-07-16 DIAGNOSIS — M7541 Impingement syndrome of right shoulder: Secondary | ICD-10-CM | POA: Diagnosis not present

## 2014-07-17 DIAGNOSIS — M7521 Bicipital tendinitis, right shoulder: Secondary | ICD-10-CM | POA: Diagnosis not present

## 2014-07-17 DIAGNOSIS — M25511 Pain in right shoulder: Secondary | ICD-10-CM | POA: Diagnosis not present

## 2014-07-17 DIAGNOSIS — M7541 Impingement syndrome of right shoulder: Secondary | ICD-10-CM | POA: Diagnosis not present

## 2014-07-17 DIAGNOSIS — M24111 Other articular cartilage disorders, right shoulder: Secondary | ICD-10-CM | POA: Diagnosis not present

## 2014-07-29 ENCOUNTER — Other Ambulatory Visit: Payer: Self-pay

## 2014-10-17 ENCOUNTER — Telehealth: Payer: Self-pay | Admitting: Family Medicine

## 2014-10-17 NOTE — Telephone Encounter (Signed)
Pt is requesting a physical can he be worked in

## 2014-10-17 NOTE — Telephone Encounter (Signed)
Okay to work in

## 2014-10-18 NOTE — Telephone Encounter (Signed)
Pt has been sch

## 2014-10-23 DIAGNOSIS — D2272 Melanocytic nevi of left lower limb, including hip: Secondary | ICD-10-CM | POA: Diagnosis not present

## 2014-10-23 DIAGNOSIS — D2271 Melanocytic nevi of right lower limb, including hip: Secondary | ICD-10-CM | POA: Diagnosis not present

## 2014-10-23 DIAGNOSIS — L821 Other seborrheic keratosis: Secondary | ICD-10-CM | POA: Diagnosis not present

## 2014-10-23 DIAGNOSIS — L570 Actinic keratosis: Secondary | ICD-10-CM | POA: Diagnosis not present

## 2014-10-23 DIAGNOSIS — L738 Other specified follicular disorders: Secondary | ICD-10-CM | POA: Diagnosis not present

## 2014-10-23 DIAGNOSIS — D225 Melanocytic nevi of trunk: Secondary | ICD-10-CM | POA: Diagnosis not present

## 2014-10-23 DIAGNOSIS — L820 Inflamed seborrheic keratosis: Secondary | ICD-10-CM | POA: Diagnosis not present

## 2014-11-05 ENCOUNTER — Ambulatory Visit (INDEPENDENT_AMBULATORY_CARE_PROVIDER_SITE_OTHER): Payer: Medicare Other | Admitting: Family Medicine

## 2014-11-05 ENCOUNTER — Encounter: Payer: Self-pay | Admitting: Family Medicine

## 2014-11-05 VITALS — BP 140/90 | Temp 97.9°F | Ht 71.5 in | Wt 188.0 lb

## 2014-11-05 DIAGNOSIS — Z23 Encounter for immunization: Secondary | ICD-10-CM | POA: Diagnosis not present

## 2014-11-05 DIAGNOSIS — N401 Enlarged prostate with lower urinary tract symptoms: Secondary | ICD-10-CM | POA: Diagnosis not present

## 2014-11-05 DIAGNOSIS — J302 Other seasonal allergic rhinitis: Secondary | ICD-10-CM

## 2014-11-05 DIAGNOSIS — R351 Nocturia: Secondary | ICD-10-CM | POA: Diagnosis not present

## 2014-11-05 DIAGNOSIS — Z0000 Encounter for general adult medical examination without abnormal findings: Secondary | ICD-10-CM

## 2014-11-05 LAB — BASIC METABOLIC PANEL
BUN: 13 mg/dL (ref 6–23)
CO2: 30 mEq/L (ref 19–32)
Calcium: 9.8 mg/dL (ref 8.4–10.5)
Chloride: 103 mEq/L (ref 96–112)
Creatinine, Ser: 0.99 mg/dL (ref 0.40–1.50)
GFR: 80.35 mL/min (ref >60.00)
Glucose, Bld: 88 mg/dL (ref 70–99)
Potassium: 4.5 mEq/L (ref 3.5–5.1)
Sodium: 143 mEq/L (ref 135–145)

## 2014-11-05 LAB — PSA: PSA: 1.33 ng/mL (ref 0.10–4.00)

## 2014-11-05 NOTE — Progress Notes (Signed)
   Subjective:    Patient ID: Jacob [name], male    DOB: [birth year], 64 y.o.   MRN: [id number]  HPI IAl is a 64 year old married male nonsmoker who comes in today for general physical examination  His underlying allergic rhinitis for which he uses an over-the-counter and steroid nasal spray daily. He stopped taking the Zyrtec and the Claritin  He says overall she feels well and has no complaints. He's been retired now for 4 years. He exercises on a daily basis. He recently started cycling with his wife.  He gets routine eye care, dental care, colonoscopy 2014 by Dr. Collene Mares was normal.  Flu shot given he declined a Pneumovax and shingles. After thorough Karle Starch explanation of the importance of these 2 vaccines he still declined.   Review of Systems  Constitutional: Negative.   HENT: Negative.   Eyes: Negative.   Respiratory: Negative.   Cardiovascular: Negative.   Gastrointestinal: Negative.   Endocrine: Negative.   Genitourinary: Negative.   Musculoskeletal: Negative.   Skin: Negative.   Allergic/Immunologic: Negative.   Neurological: Negative.   Hematological: Negative.   Psychiatric/Behavioral: Negative.        Objective:   Physical Exam  Constitutional: He is oriented to person, place, and time. He appears well-developed and well-nourished.  HENT:  Head: Normocephalic and atraumatic.  Right Ear: External ear normal.  Left Ear: External ear normal.  Nose: Nose normal.  Mouth/Throat: Oropharynx is clear and moist.  Eyes: Conjunctivae and EOM are normal. Pupils are equal, round, and reactive to light.  Neck: Normal range of motion. Neck supple. No JVD present. No tracheal deviation present. No thyromegaly present.  Cardiovascular: Normal rate, regular rhythm, normal heart sounds and intact distal pulses.  Exam reveals no gallop and no friction rub.   No murmur heard. No carotid nor aortic bruits peripheral pulses 2+ and symmetrical  Pulmonary/Chest: Effort normal and  breath sounds normal. No stridor. No respiratory distress. He has no wheezes. He has no rales. He exhibits no tenderness.  Abdominal: Soft. Bowel sounds are normal. He exhibits no distension and no mass. There is no tenderness. There is no rebound and no guarding.  Genitourinary: Rectum normal and penis normal. Guaiac negative stool. No penile tenderness.  1+ symmetrical nonnodular BPH  Musculoskeletal: Normal range of motion. He exhibits no edema or tenderness.  Lymphadenopathy:    He has no cervical adenopathy.  Neurological: He is alert and oriented to person, place, and time. He has normal reflexes. No cranial nerve deficit. He exhibits normal muscle tone.  Skin: Skin is warm and dry. No rash noted. No erythema. No pallor.  Psychiatric: He has a normal mood and affect. His behavior is normal. Judgment and thought content normal.  Nursing note and vitals reviewed.         Assessment & Plan:  Healthy male  Allergic rhinitis,,,,,,,, continue OTC steroid nasal spray  We discussed screening lab work. His lab work in 2014 was normal. Informed him that his insurance does not cover routine screening lab work. We will be happy to do it but he would have to pay for it. He says he feels well and declines any lab work  BPH///////// check PSA

## 2014-11-05 NOTE — Progress Notes (Signed)
Pre visit review using our clinic review tool, if applicable. No additional management support is needed unless otherwise documented below in the visit note. 

## 2014-11-05 NOTE — Patient Instructions (Signed)
Continue good health habits  Return in one year for general physical exam sooner if any problems 

## 2014-11-06 DIAGNOSIS — M7541 Impingement syndrome of right shoulder: Secondary | ICD-10-CM | POA: Diagnosis not present

## 2014-11-06 DIAGNOSIS — M25511 Pain in right shoulder: Secondary | ICD-10-CM | POA: Diagnosis not present

## 2014-11-06 DIAGNOSIS — M7521 Bicipital tendinitis, right shoulder: Secondary | ICD-10-CM | POA: Diagnosis not present

## 2014-11-14 DIAGNOSIS — M7541 Impingement syndrome of right shoulder: Secondary | ICD-10-CM | POA: Diagnosis not present

## 2014-11-20 DIAGNOSIS — M25511 Pain in right shoulder: Secondary | ICD-10-CM | POA: Diagnosis not present

## 2014-11-20 DIAGNOSIS — S46811D Strain of other muscles, fascia and tendons at shoulder and upper arm level, right arm, subsequent encounter: Secondary | ICD-10-CM | POA: Diagnosis not present

## 2014-11-27 DIAGNOSIS — S46011D Strain of muscle(s) and tendon(s) of the rotator cuff of right shoulder, subsequent encounter: Secondary | ICD-10-CM | POA: Diagnosis not present

## 2014-12-03 DIAGNOSIS — S43431A Superior glenoid labrum lesion of right shoulder, initial encounter: Secondary | ICD-10-CM | POA: Diagnosis not present

## 2014-12-03 DIAGNOSIS — M24111 Other articular cartilage disorders, right shoulder: Secondary | ICD-10-CM | POA: Diagnosis not present

## 2014-12-03 DIAGNOSIS — S46011A Strain of muscle(s) and tendon(s) of the rotator cuff of right shoulder, initial encounter: Secondary | ICD-10-CM | POA: Diagnosis not present

## 2014-12-03 DIAGNOSIS — M19011 Primary osteoarthritis, right shoulder: Secondary | ICD-10-CM | POA: Diagnosis not present

## 2014-12-03 DIAGNOSIS — M7541 Impingement syndrome of right shoulder: Secondary | ICD-10-CM | POA: Diagnosis not present

## 2014-12-03 DIAGNOSIS — M75121 Complete rotator cuff tear or rupture of right shoulder, not specified as traumatic: Secondary | ICD-10-CM | POA: Diagnosis not present

## 2014-12-03 DIAGNOSIS — G8918 Other acute postprocedural pain: Secondary | ICD-10-CM | POA: Diagnosis not present

## 2014-12-11 DIAGNOSIS — S46011D Strain of muscle(s) and tendon(s) of the rotator cuff of right shoulder, subsequent encounter: Secondary | ICD-10-CM | POA: Diagnosis not present

## 2014-12-11 DIAGNOSIS — Z4789 Encounter for other orthopedic aftercare: Secondary | ICD-10-CM | POA: Diagnosis not present

## 2014-12-13 DIAGNOSIS — S46011D Strain of muscle(s) and tendon(s) of the rotator cuff of right shoulder, subsequent encounter: Secondary | ICD-10-CM | POA: Diagnosis not present

## 2014-12-18 DIAGNOSIS — S46011D Strain of muscle(s) and tendon(s) of the rotator cuff of right shoulder, subsequent encounter: Secondary | ICD-10-CM | POA: Diagnosis not present

## 2014-12-20 DIAGNOSIS — S46011D Strain of muscle(s) and tendon(s) of the rotator cuff of right shoulder, subsequent encounter: Secondary | ICD-10-CM | POA: Diagnosis not present

## 2014-12-24 DIAGNOSIS — S46011D Strain of muscle(s) and tendon(s) of the rotator cuff of right shoulder, subsequent encounter: Secondary | ICD-10-CM | POA: Diagnosis not present

## 2015-01-01 DIAGNOSIS — S46011D Strain of muscle(s) and tendon(s) of the rotator cuff of right shoulder, subsequent encounter: Secondary | ICD-10-CM | POA: Diagnosis not present

## 2015-01-07 DIAGNOSIS — S46011D Strain of muscle(s) and tendon(s) of the rotator cuff of right shoulder, subsequent encounter: Secondary | ICD-10-CM | POA: Diagnosis not present

## 2015-01-10 DIAGNOSIS — S46011D Strain of muscle(s) and tendon(s) of the rotator cuff of right shoulder, subsequent encounter: Secondary | ICD-10-CM | POA: Diagnosis not present

## 2015-01-14 DIAGNOSIS — S46011D Strain of muscle(s) and tendon(s) of the rotator cuff of right shoulder, subsequent encounter: Secondary | ICD-10-CM | POA: Diagnosis not present

## 2015-01-17 DIAGNOSIS — S46011D Strain of muscle(s) and tendon(s) of the rotator cuff of right shoulder, subsequent encounter: Secondary | ICD-10-CM | POA: Diagnosis not present

## 2015-01-21 DIAGNOSIS — S46011D Strain of muscle(s) and tendon(s) of the rotator cuff of right shoulder, subsequent encounter: Secondary | ICD-10-CM | POA: Diagnosis not present

## 2015-01-23 DIAGNOSIS — S46011D Strain of muscle(s) and tendon(s) of the rotator cuff of right shoulder, subsequent encounter: Secondary | ICD-10-CM | POA: Diagnosis not present

## 2015-01-28 DIAGNOSIS — S46011D Strain of muscle(s) and tendon(s) of the rotator cuff of right shoulder, subsequent encounter: Secondary | ICD-10-CM | POA: Diagnosis not present

## 2015-02-05 DIAGNOSIS — S46011D Strain of muscle(s) and tendon(s) of the rotator cuff of right shoulder, subsequent encounter: Secondary | ICD-10-CM | POA: Diagnosis not present

## 2015-02-07 DIAGNOSIS — S46011D Strain of muscle(s) and tendon(s) of the rotator cuff of right shoulder, subsequent encounter: Secondary | ICD-10-CM | POA: Diagnosis not present

## 2015-02-07 DIAGNOSIS — Z4789 Encounter for other orthopedic aftercare: Secondary | ICD-10-CM | POA: Diagnosis not present

## 2015-02-11 DIAGNOSIS — S46011D Strain of muscle(s) and tendon(s) of the rotator cuff of right shoulder, subsequent encounter: Secondary | ICD-10-CM | POA: Diagnosis not present

## 2015-02-13 DIAGNOSIS — S46011D Strain of muscle(s) and tendon(s) of the rotator cuff of right shoulder, subsequent encounter: Secondary | ICD-10-CM | POA: Diagnosis not present

## 2015-02-18 DIAGNOSIS — S46011D Strain of muscle(s) and tendon(s) of the rotator cuff of right shoulder, subsequent encounter: Secondary | ICD-10-CM | POA: Diagnosis not present

## 2015-02-21 DIAGNOSIS — S46011D Strain of muscle(s) and tendon(s) of the rotator cuff of right shoulder, subsequent encounter: Secondary | ICD-10-CM | POA: Diagnosis not present

## 2015-02-25 DIAGNOSIS — S46011D Strain of muscle(s) and tendon(s) of the rotator cuff of right shoulder, subsequent encounter: Secondary | ICD-10-CM | POA: Diagnosis not present

## 2015-02-28 DIAGNOSIS — S46011D Strain of muscle(s) and tendon(s) of the rotator cuff of right shoulder, subsequent encounter: Secondary | ICD-10-CM | POA: Diagnosis not present

## 2015-03-04 DIAGNOSIS — S46011D Strain of muscle(s) and tendon(s) of the rotator cuff of right shoulder, subsequent encounter: Secondary | ICD-10-CM | POA: Diagnosis not present

## 2015-03-06 DIAGNOSIS — S46011D Strain of muscle(s) and tendon(s) of the rotator cuff of right shoulder, subsequent encounter: Secondary | ICD-10-CM | POA: Diagnosis not present

## 2015-03-10 DIAGNOSIS — Z4789 Encounter for other orthopedic aftercare: Secondary | ICD-10-CM | POA: Diagnosis not present

## 2015-03-10 DIAGNOSIS — S46011D Strain of muscle(s) and tendon(s) of the rotator cuff of right shoulder, subsequent encounter: Secondary | ICD-10-CM | POA: Diagnosis not present

## 2015-03-11 DIAGNOSIS — S46011D Strain of muscle(s) and tendon(s) of the rotator cuff of right shoulder, subsequent encounter: Secondary | ICD-10-CM | POA: Diagnosis not present

## 2015-03-14 DIAGNOSIS — S46011D Strain of muscle(s) and tendon(s) of the rotator cuff of right shoulder, subsequent encounter: Secondary | ICD-10-CM | POA: Diagnosis not present

## 2015-04-14 DIAGNOSIS — S46011D Strain of muscle(s) and tendon(s) of the rotator cuff of right shoulder, subsequent encounter: Secondary | ICD-10-CM | POA: Diagnosis not present

## 2015-04-14 DIAGNOSIS — Z4789 Encounter for other orthopedic aftercare: Secondary | ICD-10-CM | POA: Diagnosis not present

## 2015-06-12 DIAGNOSIS — H25013 Cortical age-related cataract, bilateral: Secondary | ICD-10-CM | POA: Diagnosis not present

## 2015-06-12 DIAGNOSIS — H2513 Age-related nuclear cataract, bilateral: Secondary | ICD-10-CM | POA: Diagnosis not present

## 2015-06-12 DIAGNOSIS — H40013 Open angle with borderline findings, low risk, bilateral: Secondary | ICD-10-CM | POA: Diagnosis not present

## 2015-06-12 DIAGNOSIS — I7090 Unspecified atherosclerosis: Secondary | ICD-10-CM | POA: Diagnosis not present

## 2015-06-12 DIAGNOSIS — H35362 Drusen (degenerative) of macula, left eye: Secondary | ICD-10-CM | POA: Diagnosis not present

## 2015-06-12 DIAGNOSIS — H35361 Drusen (degenerative) of macula, right eye: Secondary | ICD-10-CM | POA: Diagnosis not present

## 2015-11-06 ENCOUNTER — Ambulatory Visit (INDEPENDENT_AMBULATORY_CARE_PROVIDER_SITE_OTHER): Payer: Medicare Other | Admitting: Adult Health

## 2015-11-06 ENCOUNTER — Encounter: Payer: Self-pay | Admitting: Adult Health

## 2015-11-06 DIAGNOSIS — N401 Enlarged prostate with lower urinary tract symptoms: Secondary | ICD-10-CM | POA: Diagnosis not present

## 2015-11-06 DIAGNOSIS — E785 Hyperlipidemia, unspecified: Secondary | ICD-10-CM | POA: Diagnosis not present

## 2015-11-06 DIAGNOSIS — R351 Nocturia: Secondary | ICD-10-CM | POA: Diagnosis not present

## 2015-11-06 DIAGNOSIS — J00 Acute nasopharyngitis [common cold]: Secondary | ICD-10-CM

## 2015-11-06 LAB — CBC WITH DIFFERENTIAL/PLATELET
Basophils Absolute: 0.1 10*3/uL (ref 0.0–0.1)
Basophils Relative: 0.7 % (ref 0.0–3.0)
Eosinophils Absolute: 0.1 10*3/uL (ref 0.0–0.7)
Eosinophils Relative: 1.6 % (ref 0.0–5.0)
HCT: 43.8 % (ref 39.0–52.0)
Hemoglobin: 14.8 g/dL (ref 13.0–17.0)
Lymphocytes Relative: 20.4 % (ref 12.0–46.0)
Lymphs Abs: 1.5 10*3/uL (ref 0.7–4.0)
MCHC: 33.8 g/dL (ref 30.0–36.0)
MCV: 91.2 fl (ref 78.0–100.0)
Monocytes Absolute: 0.7 10*3/uL (ref 0.1–1.0)
Monocytes Relative: 9.9 % (ref 3.0–12.0)
Neutro Abs: 4.8 10*3/uL (ref 1.4–7.7)
Neutrophils Relative %: 67.4 % (ref 43.0–77.0)
Platelets: 206 10*3/uL (ref 150.0–400.0)
RBC: 4.8 Mil/uL (ref 4.22–5.81)
RDW: 13.9 % (ref 11.5–15.5)
WBC: 7.1 10*3/uL (ref 4.0–10.5)

## 2015-11-06 LAB — POC URINALSYSI DIPSTICK (AUTOMATED)
Bilirubin, UA: NEGATIVE
Blood, UA: NEGATIVE
Glucose, UA: NEGATIVE
Ketones, UA: NEGATIVE
Leukocytes, UA: NEGATIVE
Nitrite, UA: NEGATIVE
Protein, UA: NEGATIVE
Spec Grav, UA: 1.015
Urobilinogen, UA: 0.2
pH, UA: 6

## 2015-11-06 LAB — LIPID PANEL
Cholesterol: 197 mg/dL (ref 0–200)
HDL: 60.6 mg/dL (ref >39.00)
LDL Cholesterol: 121 mg/dL — ABNORMAL HIGH (ref 0–99)
NonHDL: 136.05
Total CHOL/HDL Ratio: 3
Triglycerides: 75 mg/dL (ref 0.0–149.0)
VLDL: 15 mg/dL (ref 0.0–40.0)

## 2015-11-06 LAB — TSH: TSH: 1.34 u[IU]/mL (ref 0.35–4.50)

## 2015-11-06 LAB — HEPATIC FUNCTION PANEL
ALT: 14 U/L (ref 0–53)
AST: 14 U/L (ref 0–37)
Albumin: 4.3 g/dL (ref 3.5–5.2)
Alkaline Phosphatase: 44 U/L (ref 39–117)
Bilirubin, Direct: 0.1 mg/dL (ref 0.0–0.3)
Total Bilirubin: 0.6 mg/dL (ref 0.2–1.2)
Total Protein: 6.7 g/dL (ref 6.0–8.3)

## 2015-11-06 LAB — BASIC METABOLIC PANEL
BUN: 15 mg/dL (ref 6–23)
CO2: 34 mEq/L — ABNORMAL HIGH (ref 19–32)
Calcium: 9.2 mg/dL (ref 8.4–10.5)
Chloride: 105 mEq/L (ref 96–112)
Creatinine, Ser: 1.13 mg/dL (ref 0.40–1.50)
GFR: 68.76 mL/min (ref >60.00)
Glucose, Bld: 81 mg/dL (ref 70–99)
Potassium: 4.5 mEq/L (ref 3.5–5.1)
Sodium: 144 mEq/L (ref 135–145)

## 2015-11-06 LAB — PSA: PSA: 0.96 ng/mL (ref 0.10–4.00)

## 2015-11-06 NOTE — Patient Instructions (Signed)
It was great meeting you today!  I will follow up with you regarding your lab work.   Please follow up with me in one year for your next physical exam or sooner if needed  Health Maintenance, Male A healthy lifestyle and preventative care can promote health and wellness.  Maintain regular health, dental, and eye exams.  Eat a healthy diet. Foods like vegetables, fruits, whole grains, low-fat dairy products, and lean protein foods contain the nutrients you need and are low in calories. Decrease your intake of foods high in solid fats, added sugars, and salt. Get information about a proper diet from your health care provider, if necessary.  Regular physical exercise is one of the most important things you can do for your health. Most adults should get at least 150 minutes of moderate-intensity exercise (any activity that increases your heart rate and causes you to sweat) each week. In addition, most adults need muscle-strengthening exercises on 2 or more days a week.   Maintain a healthy weight. The body mass index (BMI) is a screening tool to identify possible weight problems. It provides an estimate of body fat based on height and weight. Your health care provider can find your BMI and can help you achieve or maintain a healthy weight. For males 20 years and older:  A BMI below 18.5 is considered underweight.  A BMI of 18.5 to 24.9 is normal.  A BMI of 25 to 29.9 is considered overweight.  A BMI of 30 and above is considered obese.  Maintain normal blood lipids and cholesterol by exercising and minimizing your intake of saturated fat. Eat a balanced diet with plenty of fruits and vegetables. Blood tests for lipids and cholesterol should begin at age 34 and be repeated every 5 years. If your lipid or cholesterol levels are high, you are over age 57, or you are at high risk for heart disease, you may need your cholesterol levels checked more frequently.Ongoing high lipid and cholesterol  levels should be treated with medicines if diet and exercise are not working.  If you smoke, find out from your health care provider how to quit. If you do not use tobacco, do not start.  Lung cancer screening is recommended for adults aged 70-80 years who are at high risk for developing lung cancer because of a history of smoking. A yearly low-dose CT scan of the lungs is recommended for people who have at least a 30-pack-year history of smoking and are current smokers or have quit within the past 15 years. A pack year of smoking is smoking an average of 1 pack of cigarettes a day for 1 year (for example, a 30-pack-year history of smoking could mean smoking 1 pack a day for 30 years or 2 packs a day for 15 years). Yearly screening should continue until the smoker has stopped smoking for at least 15 years. Yearly screening should be stopped for people who develop a health problem that would prevent them from having lung cancer treatment.  If you choose to drink alcohol, do not have more than 2 drinks per day. One drink is considered to be 12 oz (360 mL) of beer, 5 oz (150 mL) of wine, or 1.5 oz (45 mL) of liquor.  Avoid the use of street drugs. Do not share needles with anyone. Ask for help if you need support or instructions about stopping the use of drugs.  High blood pressure causes heart disease and increases the risk of stroke. High blood  pressure is more likely to develop in:  People who have blood pressure in the end of the normal range (100-139/85-89 mm Hg).  People who are overweight or obese.  People who are African American.  If you are 27-59 years of age, have your blood pressure checked every 3-5 years. If you are 30 years of age or older, have your blood pressure checked every year. You should have your blood pressure measured twice--once when you are at a hospital or clinic, and once when you are not at a hospital or clinic. Record the average of the two measurements. To check your  blood pressure when you are not at a hospital or clinic, you can use:  An automated blood pressure machine at a pharmacy.  A home blood pressure monitor.  If you are 99-57 years old, ask your health care provider if you should take aspirin to prevent heart disease.  Diabetes screening involves taking a blood sample to check your fasting blood sugar level. This should be done once every 3 years after age 40 if you are at a normal weight and without risk factors for diabetes. Testing should be considered at a younger age or be carried out more frequently if you are overweight and have at least 1 risk factor for diabetes.  Colorectal cancer can be detected and often prevented. Most routine colorectal cancer screening begins at the age of 86 and continues through age 62. However, your health care provider may recommend screening at an earlier age if you have risk factors for colon cancer. On a yearly basis, your health care provider may provide home test kits to check for hidden blood in the stool. A small camera at the end of a tube may be used to directly examine the colon (sigmoidoscopy or colonoscopy) to detect the earliest forms of colorectal cancer. Talk to your health care provider about this at age 64 when routine screening begins. A direct exam of the colon should be repeated every 5-10 years through age 34, unless early forms of precancerous polyps or small growths are found.  People who are at an increased risk for hepatitis B should be screened for this virus. You are considered at high risk for hepatitis B if:  You were born in a country where hepatitis B occurs often. Talk with your health care provider about which countries are considered high risk.  Your parents were born in a high-risk country and you have not received a shot to protect against hepatitis B (hepatitis B vaccine).  You have HIV or AIDS.  You use needles to inject street drugs.  You live with, or have sex with,  someone who has hepatitis B.  You are a man who has sex with other men (MSM).  You get hemodialysis treatment.  You take certain medicines for conditions like cancer, organ transplantation, and autoimmune conditions.  Hepatitis C blood testing is recommended for all people born from [birth year] through 1965 and any individual with known risk factors for hepatitis C.  Healthy men should no longer receive prostate-specific antigen (PSA) blood tests as part of routine cancer screening. Talk to your health care provider about prostate cancer screening.  Testicular cancer screening is not recommended for adolescents or adult males who have no symptoms. Screening includes self-exam, a health care provider exam, and other screening tests. Consult with your health care provider about any symptoms you have or any concerns you have about testicular cancer.  Practice safe sex. Use condoms and avoid  high-risk sexual practices to reduce the spread of sexually transmitted infections (STIs).  You should be screened for STIs, including gonorrhea and chlamydia if:  You are sexually active and are younger than 24 years.  You are older than 24 years, and your health care provider tells you that you are at risk for this type of infection.  Your sexual activity has changed since you were last screened, and you are at an increased risk for chlamydia or gonorrhea. Ask your health care provider if you are at risk.  If you are at risk of being infected with HIV, it is recommended that you take a prescription medicine daily to prevent HIV infection. This is called pre-exposure prophylaxis (PrEP). You are considered at risk if:  You are a man who has sex with other men (MSM).  You are a heterosexual man who is sexually active with multiple partners.  You take drugs by injection.  You are sexually active with a partner who has HIV.  Talk with your health care provider about whether you are at high risk of being  infected with HIV. If you choose to begin PrEP, you should first be tested for HIV. You should then be tested every 3 months for as long as you are taking PrEP.  Use sunscreen. Apply sunscreen liberally and repeatedly throughout the day. You should seek shade when your shadow is shorter than you. Protect yourself by wearing long sleeves, pants, a wide-brimmed hat, and sunglasses year round whenever you are outdoors.  Tell your health care provider of new moles or changes in moles, especially if there is a change in shape or color. Also, tell your health care provider if a mole is larger than the size of a pencil eraser.  A one-time screening for abdominal aortic aneurysm (AAA) and surgical repair of large AAAs by ultrasound is recommended for men aged 14-75 years who are current or former smokers.  Stay current with your vaccines (immunizations).   This information is not intended to replace advice given to you by your health care provider. Make sure you discuss any questions you have with your health care provider.   Document Released: 07/17/2007 Document Revised: 02/08/2014 Document Reviewed: 06/15/2010 Elsevier Interactive Patient Education [employer].

## 2015-11-06 NOTE — Progress Notes (Signed)
Pre visit review using our clinic review tool, if applicable. No additional management support is needed unless otherwise documented below in the visit note. 

## 2015-11-06 NOTE — Progress Notes (Signed)
Subjective:    Patient ID: Jacob [name], male    DOB: [birth year], 65 y.o.   MRN: [id number]  HPI  Patient presents for yearly preventative medicine follow up due to history of hyperlipidemia, BPH with nocturia  All immunizations and health maintenance protocols were reviewed with the patient and needed orders were placed.  Appropriate screening laboratory values were ordered for the patient including screening of hyperlipidemia, renal function and hepatic function. If indicated by BPH, a PSA was ordered.  Medication reconciliation,  past medical history, social history, problem list and allergies were reviewed in detail with the patient.   Goals were established with regard to weight loss, exercise, and  diet in compliance with medications. He bikes with his wife and eats a heart healthy diet  Overall, he reports that he feels really good.   His underlying allergic rhinitis for which he uses an over-the-counter and steroid nasal spray daily. He stopped taking the Zyrtec and the Claritin  His only acute complaint is that of a " common cold" that he has had for the last 4 days. He reports that it is waxing and waning but feels as though he is getting better. He has been losing sleep due to sinus congestion  He declined the pneumona vaccination although he was educated on the importance of this.   He is up to date on colonoscopy, dental and eye exams.   He has an appointment with Dermatology in one month     Review of Systems  Constitutional: Negative.   HENT: Positive for congestion, postnasal drip and rhinorrhea. Negative for sinus pressure, sneezing and sore throat.   Eyes: Negative.   Respiratory: Positive for cough.   Cardiovascular: Negative.   Gastrointestinal: Negative.   Endocrine: Negative.   Genitourinary: Negative.   Musculoskeletal: Negative.   Skin: Negative.   Allergic/Immunologic: Negative.   Neurological: Negative.   Hematological: Negative.     Psychiatric/Behavioral: Negative.   All other systems reviewed and are negative.  No past medical history on file.  Social History   Social History  . Marital status: Married    Spouse name: N/A  . Number of children: N/A  . Years of education: N/A   Occupational History  . Not on file.   Social History Main Topics  . Smoking status: Never Smoker  . Smokeless tobacco: Never Used  . Alcohol use 3.5 oz/week    7 drink(s) per week  . Drug use: No  . Sexual activity: Not on file   Other Topics Concern  . Not on file   Social History Narrative  . No narrative on file    No past surgical history on file.  No family history on file.  No Known Allergies  No current outpatient prescriptions on file prior to visit.   No current facility-administered medications on file prior to visit.     BP 140/90 (BP Location: Left Arm, Patient Position: Sitting, Cuff Size: Normal)   Pulse 62   Temp 97.9 F (36.6 C) (Oral)   Resp 20   Ht 5' 11.5" (1.816 m)   Wt 188 lb (85.3 kg)   SpO2 98%   BMI 25.86 kg/m       Objective:   Physical Exam  Constitutional: He is oriented to person, place, and time. He appears well-developed and well-nourished. No distress.  HENT:  Head: Normocephalic and atraumatic.  Right Ear: External ear normal.  Left Ear: External ear normal.  Nose: Rhinorrhea present. No mucosal edema.  Right sinus exhibits no maxillary sinus tenderness and no frontal sinus tenderness. Left sinus exhibits no frontal sinus tenderness.  Mouth/Throat: Uvula is midline and oropharynx is clear and moist. No posterior oropharyngeal edema, posterior oropharyngeal erythema or tonsillar abscesses.  Eyes: Conjunctivae and EOM are normal. Pupils are equal, round, and reactive to light. Right eye exhibits no discharge. Left eye exhibits no discharge. No scleral icterus.  Neck: Normal range of motion and full passive range of motion without pain. Neck supple. No JVD present. Carotid  bruit is not present. No tracheal deviation present. No thyroid mass and no thyromegaly present.  Cardiovascular: Normal rate, regular rhythm, normal heart sounds and intact distal pulses.  Exam reveals no gallop and no friction rub.   No murmur heard. Pulmonary/Chest: Effort normal and breath sounds normal. No respiratory distress. He has no wheezes. He has no rales. He exhibits no tenderness.  Abdominal: Soft. Bowel sounds are normal. He exhibits no distension and no mass. There is no tenderness. There is no rebound and no guarding.  Genitourinary: Rectum normal and prostate normal. Rectal exam shows guaiac negative stool.  Musculoskeletal: Normal range of motion. He exhibits no edema, tenderness or deformity.  Lymphadenopathy:    He has no cervical adenopathy.  Neurological: He is alert and oriented to person, place, and time. He has normal reflexes. He displays normal reflexes. No cranial nerve deficit. He exhibits normal muscle tone. Coordination normal.  Skin: Skin is warm and dry. No rash noted. He is not diaphoretic. No erythema. No pallor.  Psychiatric: He has a normal mood and affect. His behavior is normal. Judgment and thought content normal.  Nursing note and vitals reviewed.     Assessment & Plan:  1. Hyperlipidemia, unspecified hyperlipidemia type  - POCT Urinalysis Dipstick (Automated) - PSA - Basic metabolic panel - CBC with Differential/Platelet - Hepatic function panel - Lipid panel - TSH - Consider adding statin  - EKG 12-Lead- Sinus  Bradycardia  Low voltage in precordial leads.   -Anteroseptal infarct -age undetermined. Rate 54. Consistent with previous EKG  2. BPH associated with nocturia  - POCT Urinalysis Dipstick (Automated) - PSA - Basic metabolic panel - CBC with Differential/Platelet - Hepatic function panel - Lipid panel - TSH - No issues with this currently.  3. Common cold - Continue with Flonase and can add a decongestant    * Elevated BP  today, will continue to monitor.   Dorothyann Peng, NP

## 2015-12-02 ENCOUNTER — Ambulatory Visit (INDEPENDENT_AMBULATORY_CARE_PROVIDER_SITE_OTHER): Payer: Medicare Other | Admitting: Emergency Medicine

## 2015-12-02 DIAGNOSIS — Z23 Encounter for immunization: Secondary | ICD-10-CM | POA: Diagnosis not present

## 2015-12-31 DIAGNOSIS — H5319 Other subjective visual disturbances: Secondary | ICD-10-CM | POA: Diagnosis not present

## 2015-12-31 DIAGNOSIS — H43391 Other vitreous opacities, right eye: Secondary | ICD-10-CM | POA: Diagnosis not present

## 2015-12-31 DIAGNOSIS — H40013 Open angle with borderline findings, low risk, bilateral: Secondary | ICD-10-CM | POA: Diagnosis not present

## 2015-12-31 DIAGNOSIS — H40051 Ocular hypertension, right eye: Secondary | ICD-10-CM | POA: Diagnosis not present

## 2016-01-05 DIAGNOSIS — M7541 Impingement syndrome of right shoulder: Secondary | ICD-10-CM | POA: Diagnosis not present

## 2016-01-05 DIAGNOSIS — Z9889 Other specified postprocedural states: Secondary | ICD-10-CM | POA: Diagnosis not present

## 2016-01-07 DIAGNOSIS — D1801 Hemangioma of skin and subcutaneous tissue: Secondary | ICD-10-CM | POA: Diagnosis not present

## 2016-01-07 DIAGNOSIS — D2271 Melanocytic nevi of right lower limb, including hip: Secondary | ICD-10-CM | POA: Diagnosis not present

## 2016-01-07 DIAGNOSIS — L570 Actinic keratosis: Secondary | ICD-10-CM | POA: Diagnosis not present

## 2016-01-07 DIAGNOSIS — L814 Other melanin hyperpigmentation: Secondary | ICD-10-CM | POA: Diagnosis not present

## 2016-01-07 DIAGNOSIS — L821 Other seborrheic keratosis: Secondary | ICD-10-CM | POA: Diagnosis not present

## 2016-01-07 DIAGNOSIS — L918 Other hypertrophic disorders of the skin: Secondary | ICD-10-CM | POA: Diagnosis not present

## 2016-01-07 DIAGNOSIS — D2272 Melanocytic nevi of left lower limb, including hip: Secondary | ICD-10-CM | POA: Diagnosis not present

## 2016-01-09 ENCOUNTER — Encounter (INDEPENDENT_AMBULATORY_CARE_PROVIDER_SITE_OTHER): Payer: Medicare Other | Admitting: Ophthalmology

## 2016-01-09 DIAGNOSIS — H2513 Age-related nuclear cataract, bilateral: Secondary | ICD-10-CM | POA: Diagnosis not present

## 2016-01-09 DIAGNOSIS — H43813 Vitreous degeneration, bilateral: Secondary | ICD-10-CM

## 2016-06-11 DIAGNOSIS — S93401A Sprain of unspecified ligament of right ankle, initial encounter: Secondary | ICD-10-CM | POA: Diagnosis not present

## 2016-06-14 DIAGNOSIS — H40013 Open angle with borderline findings, low risk, bilateral: Secondary | ICD-10-CM | POA: Diagnosis not present

## 2016-06-14 DIAGNOSIS — H35362 Drusen (degenerative) of macula, left eye: Secondary | ICD-10-CM | POA: Diagnosis not present

## 2016-06-14 DIAGNOSIS — H2513 Age-related nuclear cataract, bilateral: Secondary | ICD-10-CM | POA: Diagnosis not present

## 2016-06-14 DIAGNOSIS — H25013 Cortical age-related cataract, bilateral: Secondary | ICD-10-CM | POA: Diagnosis not present

## 2016-07-29 DIAGNOSIS — S96812A Strain of other specified muscles and tendons at ankle and foot level, left foot, initial encounter: Secondary | ICD-10-CM | POA: Diagnosis not present

## 2016-10-22 ENCOUNTER — Encounter: Payer: Self-pay | Admitting: Family Medicine

## 2016-11-17 ENCOUNTER — Encounter: Payer: Medicare Other | Admitting: Adult Health

## 2016-11-30 ENCOUNTER — Encounter: Payer: Self-pay | Admitting: Adult Health

## 2016-11-30 ENCOUNTER — Ambulatory Visit (INDEPENDENT_AMBULATORY_CARE_PROVIDER_SITE_OTHER): Payer: Medicare Other | Admitting: Adult Health

## 2016-11-30 VITALS — BP 130/90 | Temp 98.1°F | Ht 70.25 in | Wt 188.0 lb

## 2016-11-30 DIAGNOSIS — Z23 Encounter for immunization: Secondary | ICD-10-CM

## 2016-11-30 DIAGNOSIS — R351 Nocturia: Secondary | ICD-10-CM | POA: Diagnosis not present

## 2016-11-30 DIAGNOSIS — N401 Enlarged prostate with lower urinary tract symptoms: Secondary | ICD-10-CM

## 2016-11-30 DIAGNOSIS — Z1159 Encounter for screening for other viral diseases: Secondary | ICD-10-CM | POA: Diagnosis not present

## 2016-11-30 DIAGNOSIS — E782 Mixed hyperlipidemia: Secondary | ICD-10-CM

## 2016-11-30 LAB — CBC WITH DIFFERENTIAL/PLATELET
Basophils Absolute: 0.1 10*3/uL (ref 0.0–0.1)
Basophils Relative: 1.6 % (ref 0.0–3.0)
Eosinophils Absolute: 0.1 10*3/uL (ref 0.0–0.7)
Eosinophils Relative: 1.9 % (ref 0.0–5.0)
HCT: 44.9 % (ref 39.0–52.0)
Hemoglobin: 14.9 g/dL (ref 13.0–17.0)
Lymphocytes Relative: 31.8 % (ref 12.0–46.0)
Lymphs Abs: 1.7 10*3/uL (ref 0.7–4.0)
MCHC: 33.1 g/dL (ref 30.0–36.0)
MCV: 93.3 fl (ref 78.0–100.0)
Monocytes Absolute: 0.4 10*3/uL (ref 0.1–1.0)
Monocytes Relative: 7.3 % (ref 3.0–12.0)
Neutro Abs: 3 10*3/uL (ref 1.4–7.7)
Neutrophils Relative %: 57.4 % (ref 43.0–77.0)
Platelets: 215 10*3/uL (ref 150.0–400.0)
RBC: 4.82 Mil/uL (ref 4.22–5.81)
RDW: 13.9 % (ref 11.5–15.5)
WBC: 5.2 10*3/uL (ref 4.0–10.5)

## 2016-11-30 LAB — LIPID PANEL
Cholesterol: 218 mg/dL — ABNORMAL HIGH (ref 0–200)
HDL: 57.1 mg/dL (ref >39.00)
LDL Cholesterol: 147 mg/dL — ABNORMAL HIGH (ref 0–99)
NonHDL: 160.94
Total CHOL/HDL Ratio: 4
Triglycerides: 72 mg/dL (ref 0.0–149.0)
VLDL: 14.4 mg/dL (ref 0.0–40.0)

## 2016-11-30 LAB — HEPATIC FUNCTION PANEL
ALT: 17 U/L (ref 0–53)
AST: 17 U/L (ref 0–37)
Albumin: 4.5 g/dL (ref 3.5–5.2)
Alkaline Phosphatase: 41 U/L (ref 39–117)
Bilirubin, Direct: 0.1 mg/dL (ref 0.0–0.3)
Total Bilirubin: 0.4 mg/dL (ref 0.2–1.2)
Total Protein: 6.5 g/dL (ref 6.0–8.3)

## 2016-11-30 LAB — BASIC METABOLIC PANEL
BUN: 20 mg/dL (ref 6–23)
CO2: 31 mEq/L (ref 19–32)
Calcium: 9.7 mg/dL (ref 8.4–10.5)
Chloride: 104 mEq/L (ref 96–112)
Creatinine, Ser: 1.05 mg/dL (ref 0.40–1.50)
GFR: 74.6 mL/min (ref >60.00)
Glucose, Bld: 86 mg/dL (ref 70–99)
Potassium: 4.3 mEq/L (ref 3.5–5.1)
Sodium: 143 mEq/L (ref 135–145)

## 2016-11-30 LAB — PSA: PSA: 1.61 ng/mL (ref 0.10–4.00)

## 2016-11-30 NOTE — Patient Instructions (Signed)
It was great seeing you today   Your exam was great! Continue to eat healthy and exercise  Follow up in one year or sooner if needed  Health Maintenance, Male A healthy lifestyle and preventative care can promote health and wellness.  Maintain regular health, dental, and eye exams.  Eat a healthy diet. Foods like vegetables, fruits, whole grains, low-fat dairy products, and lean protein foods contain the nutrients you need and are low in calories. Decrease your intake of foods high in solid fats, added sugars, and salt. Get information about a proper diet from your health care provider, if necessary.  Regular physical exercise is one of the most important things you can do for your health. Most adults should get at least 150 minutes of moderate-intensity exercise (any activity that increases your heart rate and causes you to sweat) each week. In addition, most adults need muscle-strengthening exercises on 2 or more days a week.   Maintain a healthy weight. The body mass index (BMI) is a screening tool to identify possible weight problems. It provides an estimate of body fat based on height and weight. Your health care provider can find your BMI and can help you achieve or maintain a healthy weight. For males 20 years and older:  A BMI below 18.5 is considered underweight.  A BMI of 18.5 to 24.9 is normal.  A BMI of 25 to 29.9 is considered overweight.  A BMI of 30 and above is considered obese.  Maintain normal blood lipids and cholesterol by exercising and minimizing your intake of saturated fat. Eat a balanced diet with plenty of fruits and vegetables. Blood tests for lipids and cholesterol should begin at age 46 and be repeated every 5 years. If your lipid or cholesterol levels are high, you are over age 29, or you are at high risk for heart disease, you may need your cholesterol levels checked more frequently.Ongoing high lipid and cholesterol levels should be treated with medicines if  diet and exercise are not working.  If you smoke, find out from your health care provider how to quit. If you do not use tobacco, do not start.  Lung cancer screening is recommended for adults aged 55-80 years who are at high risk for developing lung cancer because of a history of smoking. A yearly low-dose CT scan of the lungs is recommended for people who have at least a 30-pack-year history of smoking and are current smokers or have quit within the past 15 years. A pack year of smoking is smoking an average of 1 pack of cigarettes a day for 1 year (for example, a 30-pack-year history of smoking could mean smoking 1 pack a day for 30 years or 2 packs a day for 15 years). Yearly screening should continue until the smoker has stopped smoking for at least 15 years. Yearly screening should be stopped for people who develop a health problem that would prevent them from having lung cancer treatment.  If you choose to drink alcohol, do not have more than 2 drinks per day. One drink is considered to be 12 oz (360 mL) of beer, 5 oz (150 mL) of wine, or 1.5 oz (45 mL) of liquor.  Avoid the use of street drugs. Do not share needles with anyone. Ask for help if you need support or instructions about stopping the use of drugs.  High blood pressure causes heart disease and increases the risk of stroke. High blood pressure is more likely to develop in:  People who have blood pressure in the end of the normal range (100-139/85-89 mm Hg).  People who are overweight or obese.  People who are African American.  If you are 60-4 years of age, have your blood pressure checked every 3-5 years. If you are 65 years of age or older, have your blood pressure checked every year. You should have your blood pressure measured twice--once when you are at a hospital or clinic, and once when you are not at a hospital or clinic. Record the average of the two measurements. To check your blood pressure when you are not at a  hospital or clinic, you can use:  An automated blood pressure machine at a pharmacy.  A home blood pressure monitor.  If you are 12-49 years old, ask your health care provider if you should take aspirin to prevent heart disease.  Diabetes screening involves taking a blood sample to check your fasting blood sugar level. This should be done once every 3 years after age 42 if you are at a normal weight and without risk factors for diabetes. Testing should be considered at a younger age or be carried out more frequently if you are overweight and have at least 1 risk factor for diabetes.  Colorectal cancer can be detected and often prevented. Most routine colorectal cancer screening begins at the age of 56 and continues through age 41. However, your health care provider may recommend screening at an earlier age if you have risk factors for colon cancer. On a yearly basis, your health care provider may provide home test kits to check for hidden blood in the stool. A small camera at the end of a tube may be used to directly examine the colon (sigmoidoscopy or colonoscopy) to detect the earliest forms of colorectal cancer. Talk to your health care provider about this at age 84 when routine screening begins. A direct exam of the colon should be repeated every 5-10 years through age 60, unless early forms of precancerous polyps or small growths are found.  People who are at an increased risk for hepatitis B should be screened for this virus. You are considered at high risk for hepatitis B if:  You were born in a country where hepatitis B occurs often. Talk with your health care provider about which countries are considered high risk.  Your parents were born in a high-risk country and you have not received a shot to protect against hepatitis B (hepatitis B vaccine).  You have HIV or AIDS.  You use needles to inject street drugs.  You live with, or have sex with, someone who has hepatitis B.  You are a  man who has sex with other men (MSM).  You get hemodialysis treatment.  You take certain medicines for conditions like cancer, organ transplantation, and autoimmune conditions.  Hepatitis C blood testing is recommended for all people born from [birth year] through 1965 and any individual with known risk factors for hepatitis C.  Healthy men should no longer receive prostate-specific antigen (PSA) blood tests as part of routine cancer screening. Talk to your health care provider about prostate cancer screening.  Testicular cancer screening is not recommended for adolescents or adult males who have no symptoms. Screening includes self-exam, a health care provider exam, and other screening tests. Consult with your health care provider about any symptoms you have or any concerns you have about testicular cancer.  Practice safe sex. Use condoms and avoid high-risk sexual practices to reduce the spread of  sexually transmitted infections (STIs).  You should be screened for STIs, including gonorrhea and chlamydia if:  You are sexually active and are younger than 24 years.  You are older than 24 years, and your health care provider tells you that you are at risk for this type of infection.  Your sexual activity has changed since you were last screened, and you are at an increased risk for chlamydia or gonorrhea. Ask your health care provider if you are at risk.  If you are at risk of being infected with HIV, it is recommended that you take a prescription medicine daily to prevent HIV infection. This is called pre-exposure prophylaxis (PrEP). You are considered at risk if:  You are a man who has sex with other men (MSM).  You are a heterosexual man who is sexually active with multiple partners.  You take drugs by injection.  You are sexually active with a partner who has HIV.  Talk with your health care provider about whether you are at high risk of being infected with HIV. If you choose to begin PrEP,  you should first be tested for HIV. You should then be tested every 3 months for as long as you are taking PrEP.  Use sunscreen. Apply sunscreen liberally and repeatedly throughout the day. You should seek shade when your shadow is shorter than you. Protect yourself by wearing long sleeves, pants, a wide-brimmed hat, and sunglasses year round whenever you are outdoors.  Tell your health care provider of new moles or changes in moles, especially if there is a change in shape or color. Also, tell your health care provider if a mole is larger than the size of a pencil eraser.  A one-time screening for abdominal aortic aneurysm (AAA) and surgical repair of large AAAs by ultrasound is recommended for men aged 14-75 years who are current or former smokers.  Stay current with your vaccines (immunizations).   This information is not intended to replace advice given to you by your health care provider. Make sure you discuss any questions you have with your health care provider.   Document Released: 07/17/2007 Document Revised: 02/08/2014 Document Reviewed: 06/15/2010 Elsevier Interactive Patient Education [employer].

## 2016-11-30 NOTE — Progress Notes (Signed)
Subjective:    Patient ID: Jacob Bowens., male    DOB: 1950/10/21, 66 y.o.   MRN: [id number]  HPI  Patient presents for yearly preventative medicine examination. He is a pleasant 66 year old male who  has no past medical history on file.  All immunizations and health maintenance protocols were reviewed with the patient and needed orders were placed. He is due for Prevnar 13  Influenza vaccinations   Appropriate screening laboratory values were ordered for the patient including screening of hyperlipidemia, renal function and hepatic function. If indicated by BPH, a PSA was ordered.  Medication reconciliation,  past medical history, social history, problem list and allergies were reviewed in detail with the patient  Goals were established with regard to weight loss, exercise, and  diet in compliance with medications. He continues to work out and eat healthy.   He is up to date on his colonoscopy and he participates in routine dental and vision care  End of life planning was discussed. He has an advanced directive and living will.   He has no acute complaints today. States " I am feeling really good!"   Review of Systems  Constitutional: Negative.   HENT: Negative.   Eyes: Negative.   Respiratory: Negative.   Cardiovascular: Negative.   Gastrointestinal: Negative.   Endocrine: Negative.   Genitourinary: Negative.   Musculoskeletal: Negative.   Skin: Negative.   Allergic/Immunologic: Negative.   Neurological: Negative.   Hematological: Negative.   Psychiatric/Behavioral: Negative.   All other systems reviewed and are negative.  No past medical history on file.  Social History   Social History  . Marital status: Married    Spouse name: N/A  . Number of children: N/A  . Years of education: N/A   Occupational History  . Not on file.   Social History Main Topics  . Smoking status: Never Smoker  . Smokeless tobacco: Never Used  . Alcohol use 3.5 oz/week    7  drink(s) per week  . Drug use: No  . Sexual activity: Not on file   Other Topics Concern  . Not on file   Social History Narrative  . No narrative on file    No past surgical history on file.  No family history on file.  No Known Allergies  Current Outpatient Prescriptions on File Prior to Visit  Medication Sig Dispense Refill  . fluticasone (FLONASE ALLERGY RELIEF) 50 MCG/ACT nasal spray Place 2 sprays into both nostrils daily. OTC Medication     No current facility-administered medications on file prior to visit.     BP 130/90 (BP Location: Left Arm)   Temp 98.1 F (36.7 C) (Oral)   Ht 5' 10.25" (1.784 m)   Wt 188 lb (85.3 kg)   BMI 26.78 kg/m       Objective:   Physical Exam  Constitutional: He is oriented to person, place, and time. He appears well-developed and well-nourished. No distress.  HENT:  Head: Normocephalic and atraumatic.  Right Ear: External ear normal.  Left Ear: External ear normal.  Nose: Nose normal.  Mouth/Throat: Oropharynx is clear and moist. No oropharyngeal exudate.  Eyes: Pupils are equal, round, and reactive to light. Conjunctivae and EOM are normal. Right eye exhibits no discharge. Left eye exhibits no discharge. No scleral icterus.  Neck: Normal range of motion. Neck supple. No JVD present. No tracheal deviation present. No thyromegaly present.  Cardiovascular: Normal rate, regular rhythm, normal heart sounds and intact distal pulses.  Exam reveals no gallop and no friction rub.   No murmur heard. Pulmonary/Chest: Effort normal and breath sounds normal. No stridor. No respiratory distress. He has no wheezes. He has no rales. He exhibits no tenderness.  Abdominal: Soft. Bowel sounds are normal. He exhibits no distension and no mass. There is no tenderness. There is no rebound and no guarding.  Genitourinary: Rectum normal. Prostate is enlarged. Prostate is not tender.  Musculoskeletal: Normal range of motion. He exhibits no edema,  tenderness or deformity.  Lymphadenopathy:    He has no cervical adenopathy.  Neurological: He is alert and oriented to person, place, and time. He has normal reflexes. He displays normal reflexes. No cranial nerve deficit. He exhibits normal muscle tone. Coordination normal.  Skin: Skin is warm and dry. No rash noted. He is not diaphoretic. No erythema. No pallor.  Psychiatric: He has a normal mood and affect. His behavior is normal. Judgment and thought content normal.  Nursing note and vitals reviewed.     Assessment & Plan:  * Benign exam. He is a healthy 66 year old male. Encouraged to continue to exercise and eat healthy. If labs come back normal, I am ok with him following up in one year or sooner if needed  1. BPH associated with nocturia - No issues  - Basic metabolic panel - CBC with Differential/Platelet - Lipid panel - PSA - Hepatic function panel  2. Mixed hyperlipidemia - Consider statin  - Basic metabolic panel - CBC with Differential/Platelet - Lipid panel - PSA - Hepatic function panel  3. Need for hepatitis C screening test  - Hep C Antibody  4. Need for influenza vaccination  - Flu vaccine HIGH DOSE PF (Fluzone High dose)  5. Need for vaccination for Strep pneumoniae  - Pneumococcal conjugate vaccine 13-valent  Dorothyann Peng, NP

## 2016-12-01 LAB — HEPATITIS C ANTIBODY
Hepatitis C Ab: NONREACTIVE
SIGNAL TO CUT-OFF: 0.01 (ref <1.00)

## 2016-12-29 DIAGNOSIS — H1859 Other hereditary corneal dystrophies: Secondary | ICD-10-CM | POA: Diagnosis not present

## 2016-12-29 DIAGNOSIS — H40013 Open angle with borderline findings, low risk, bilateral: Secondary | ICD-10-CM | POA: Diagnosis not present

## 2016-12-29 DIAGNOSIS — H04123 Dry eye syndrome of bilateral lacrimal glands: Secondary | ICD-10-CM | POA: Diagnosis not present

## 2016-12-29 DIAGNOSIS — H01009 Unspecified blepharitis unspecified eye, unspecified eyelid: Secondary | ICD-10-CM | POA: Diagnosis not present

## 2017-03-09 DIAGNOSIS — L853 Xerosis cutis: Secondary | ICD-10-CM | POA: Diagnosis not present

## 2017-03-09 DIAGNOSIS — L821 Other seborrheic keratosis: Secondary | ICD-10-CM | POA: Diagnosis not present

## 2017-03-09 DIAGNOSIS — L738 Other specified follicular disorders: Secondary | ICD-10-CM | POA: Diagnosis not present

## 2017-03-09 DIAGNOSIS — L814 Other melanin hyperpigmentation: Secondary | ICD-10-CM | POA: Diagnosis not present

## 2017-03-09 DIAGNOSIS — L300 Nummular dermatitis: Secondary | ICD-10-CM | POA: Diagnosis not present

## 2017-03-09 DIAGNOSIS — D1801 Hemangioma of skin and subcutaneous tissue: Secondary | ICD-10-CM | POA: Diagnosis not present

## 2017-03-09 DIAGNOSIS — D2272 Melanocytic nevi of left lower limb, including hip: Secondary | ICD-10-CM | POA: Diagnosis not present

## 2017-03-09 DIAGNOSIS — L570 Actinic keratosis: Secondary | ICD-10-CM | POA: Diagnosis not present

## 2017-03-09 DIAGNOSIS — B353 Tinea pedis: Secondary | ICD-10-CM | POA: Diagnosis not present

## 2017-06-08 DIAGNOSIS — L570 Actinic keratosis: Secondary | ICD-10-CM | POA: Diagnosis not present

## 2017-06-15 DIAGNOSIS — H2513 Age-related nuclear cataract, bilateral: Secondary | ICD-10-CM | POA: Diagnosis not present

## 2017-06-15 DIAGNOSIS — I7090 Unspecified atherosclerosis: Secondary | ICD-10-CM | POA: Diagnosis not present

## 2017-06-15 DIAGNOSIS — H25013 Cortical age-related cataract, bilateral: Secondary | ICD-10-CM | POA: Diagnosis not present

## 2017-06-15 DIAGNOSIS — H40013 Open angle with borderline findings, low risk, bilateral: Secondary | ICD-10-CM | POA: Diagnosis not present

## 2017-12-09 ENCOUNTER — Ambulatory Visit (INDEPENDENT_AMBULATORY_CARE_PROVIDER_SITE_OTHER): Payer: Medicare Other | Admitting: Adult Health

## 2017-12-09 ENCOUNTER — Encounter: Payer: Self-pay | Admitting: Adult Health

## 2017-12-09 VITALS — BP 130/82 | HR 73 | Temp 97.9°F | Ht 70.25 in | Wt 186.8 lb

## 2017-12-09 DIAGNOSIS — Z23 Encounter for immunization: Secondary | ICD-10-CM

## 2017-12-09 DIAGNOSIS — R351 Nocturia: Secondary | ICD-10-CM

## 2017-12-09 DIAGNOSIS — N401 Enlarged prostate with lower urinary tract symptoms: Secondary | ICD-10-CM

## 2017-12-09 DIAGNOSIS — E782 Mixed hyperlipidemia: Secondary | ICD-10-CM | POA: Diagnosis not present

## 2017-12-09 LAB — CBC WITH DIFFERENTIAL/PLATELET
Basophils Absolute: 0.1 10*3/uL (ref 0.0–0.1)
Basophils Relative: 1.8 % (ref 0.0–3.0)
Eosinophils Absolute: 0.1 10*3/uL (ref 0.0–0.7)
Eosinophils Relative: 1.3 % (ref 0.0–5.0)
HCT: 45.2 % (ref 39.0–52.0)
Hemoglobin: 15.8 g/dL (ref 13.0–17.0)
Lymphocytes Relative: 32 % (ref 12.0–46.0)
Lymphs Abs: 1.6 10*3/uL (ref 0.7–4.0)
MCHC: 34.9 g/dL (ref 30.0–36.0)
MCV: 91 fl (ref 78.0–100.0)
Monocytes Absolute: 0.4 10*3/uL (ref 0.1–1.0)
Monocytes Relative: 8.7 % (ref 3.0–12.0)
Neutro Abs: 2.8 10*3/uL (ref 1.4–7.7)
Neutrophils Relative %: 56.2 % (ref 43.0–77.0)
Platelets: 208 10*3/uL (ref 150.0–400.0)
RBC: 4.97 Mil/uL (ref 4.22–5.81)
RDW: 13.6 % (ref 11.5–15.5)
WBC: 5 10*3/uL (ref 4.0–10.5)

## 2017-12-09 LAB — COMPREHENSIVE METABOLIC PANEL
ALT: 16 U/L (ref 0–53)
AST: 17 U/L (ref 0–37)
Albumin: 4.8 g/dL (ref 3.5–5.2)
Alkaline Phosphatase: 37 U/L — ABNORMAL LOW (ref 39–117)
BUN: 25 mg/dL — ABNORMAL HIGH (ref 6–23)
CO2: 31 mEq/L (ref 19–32)
Calcium: 9.9 mg/dL (ref 8.4–10.5)
Chloride: 103 mEq/L (ref 96–112)
Creatinine, Ser: 1.19 mg/dL (ref 0.40–1.50)
GFR: 64.38 mL/min (ref >60.00)
Glucose, Bld: 90 mg/dL (ref 70–99)
Potassium: 4.8 mEq/L (ref 3.5–5.1)
Sodium: 141 mEq/L (ref 135–145)
Total Bilirubin: 0.6 mg/dL (ref 0.2–1.2)
Total Protein: 7.2 g/dL (ref 6.0–8.3)

## 2017-12-09 LAB — LIPID PANEL
Cholesterol: 222 mg/dL — ABNORMAL HIGH (ref 0–200)
HDL: 63 mg/dL (ref >39.00)
LDL Cholesterol: 143 mg/dL — ABNORMAL HIGH (ref 0–99)
NonHDL: 158.9
Total CHOL/HDL Ratio: 4
Triglycerides: 78 mg/dL (ref 0.0–149.0)
VLDL: 15.6 mg/dL (ref 0.0–40.0)

## 2017-12-09 LAB — PSA: PSA: 1.5 ng/mL (ref 0.10–4.00)

## 2017-12-09 LAB — TSH: TSH: 1.78 u[IU]/mL (ref 0.35–4.50)

## 2017-12-09 NOTE — Progress Notes (Signed)
Subjective:    Patient ID: Jacob Bowens., male    DOB: 22-Jun-1950, 67 y.o.   MRN: [id number]  HPI  Patient presents for yearly follow up exam. He is a pleasant 67 year old male who  has a past medical history of BPH associated with nocturia and Hyperlipidemia.  Hyperlipidemia - diet controlled in the past. Not currently prescribed statin   Lab Results  Component Value Date   CHOL 218 (H) 11/30/2016   HDL 57.10 11/30/2016   LDLCALC 147 (H) 11/30/2016   LDLDIRECT 133.4 09/20/2011   TRIG 72.0 11/30/2016   CHOLHDL 4 11/30/2016   BPH - reports getting up one time in the night to urinate. He does not find this to be a bother and does not want to start medications   All immunizations and health maintenance protocols were reviewed with the patient and needed orders were placed. Due for yearly flu vaccination and Pneumovax 23  Appropriate screening laboratory values were ordered for the patient including screening of hyperlipidemia, renal function and hepatic function. If indicated by BPH, a PSA was ordered.  Medication reconciliation,  past medical history, social history, problem list and allergies were reviewed in detail with the patient  Goals were established with regard to weight loss, exercise, and  diet in compliance with medications. He is training for a 70 mile bike ride on his 68th birthday   Wt Readings from Last 3 Encounters:  12/09/17 186 lb 12.8 oz (84.7 kg)  11/30/16 188 lb (85.3 kg)  11/06/15 188 lb (85.3 kg)    End of life planning was discussed.  He has an advanced directive and living will  He is up-to-date on routine colonoscopy, dental and vision screens.  No acute complaints today  He has been seen by Dermatology this year   Review of Systems  Constitutional: Negative.   HENT: Negative.   Eyes: Negative.   Respiratory: Negative.   Cardiovascular: Negative.   Gastrointestinal: Negative.   Endocrine: Negative.   Genitourinary: Negative.     Musculoskeletal: Negative.   Skin: Negative.   Allergic/Immunologic: Negative.   Neurological: Negative.   Hematological: Negative.   Psychiatric/Behavioral: Negative.   All other systems reviewed and are negative.  Past Medical History:  Diagnosis Date  . BPH associated with nocturia   . Hyperlipidemia     Social History   Socioeconomic History  . Marital status: Married    Spouse name: Not on file  . Number of children: Not on file  . Years of education: Not on file  . Highest education level: Not on file  Occupational History  . Not on file  Social Needs  . Financial resource strain: Not on file  . Food insecurity:    Worry: Not on file    Inability: Not on file  . Transportation needs:    Medical: Not on file    Non-medical: Not on file  Tobacco Use  . Smoking status: Never Smoker  . Smokeless tobacco: Never Used  Substance and Sexual Activity  . Alcohol use: Yes    Alcohol/week: 7.0 standard drinks    Types: 7 drink(s) per week  . Drug use: No  . Sexual activity: Not on file  Lifestyle  . Physical activity:    Days per week: Not on file    Minutes per session: Not on file  . Stress: Not on file  Relationships  . Social connections:    Talks on phone: Not on file  Gets together: Not on file    Attends religious service: Not on file    Active member of club or organization: Not on file    Attends meetings of clubs or organizations: Not on file    Relationship status: Not on file  . Intimate partner violence:    Fear of current or ex partner: Not on file    Emotionally abused: Not on file    Physically abused: Not on file    Forced sexual activity: Not on file  Other Topics Concern  . Not on file  Social History Narrative  . Not on file    History reviewed. No pertinent surgical history.  History reviewed. No pertinent family history.  No Known Allergies  Current Outpatient Medications on File Prior to Visit  Medication Sig Dispense Refill   . cetirizine (ZYRTEC) 10 MG tablet Take 10 mg by mouth daily.    . fluticasone (FLONASE ALLERGY RELIEF) 50 MCG/ACT nasal spray Place 2 sprays into both nostrils daily. OTC Medication    . levocetirizine (XYZAL) 5 MG tablet Take 5 mg by mouth every evening.     No current facility-administered medications on file prior to visit.     BP 130/82 (BP Location: Right Arm, Patient Position: Sitting, Cuff Size: Large)   Pulse 73   Temp 97.9 F (36.6 C) (Oral)   Ht 5' 10.25" (1.784 m)   Wt 186 lb 12.8 oz (84.7 kg)   SpO2 100%   BMI 26.61 kg/m       Objective:   Physical Exam  Constitutional: He is oriented to person, place, and time. He appears well-developed and well-nourished. No distress.  HENT:  Head: Normocephalic and atraumatic.  Right Ear: External ear normal.  Left Ear: External ear normal.  Nose: Nose normal.  Mouth/Throat: Oropharynx is clear and moist. No oropharyngeal exudate.  Eyes: Pupils are equal, round, and reactive to light. Conjunctivae and EOM are normal. Right eye exhibits no discharge. Left eye exhibits no discharge.  Neck: Normal range of motion. Neck supple. No JVD present. No tracheal deviation present. No thyromegaly present.  Cardiovascular: Normal rate, normal heart sounds and intact distal pulses. Exam reveals no gallop and no friction rub.  No murmur heard. Pulmonary/Chest: Effort normal and breath sounds normal. No stridor. No respiratory distress. He has no wheezes. He has no rales. He exhibits no tenderness.  Abdominal: Soft. Bowel sounds are normal. He exhibits no distension and no mass. There is no tenderness. There is no rebound and no guarding. No hernia.  Musculoskeletal: Normal range of motion. He exhibits no edema, tenderness or deformity.  Lymphadenopathy:    He has no cervical adenopathy.  Neurological: He is alert and oriented to person, place, and time. He displays normal reflexes. No cranial nerve deficit or sensory deficit. He exhibits  normal muscle tone. Coordination normal.  Skin: Skin is warm and dry. Capillary refill takes less than 2 seconds. No rash noted. He is not diaphoretic. No erythema. No pallor.  Psychiatric: He has a normal mood and affect. His behavior is normal. Judgment and thought content normal.      Assessment & Plan:  1. Mixed hyperlipidemia - Consider statin  - CBC with Differential/Platelet - Comprehensive metabolic panel - Lipid panel - TSH  2. BPH associated with nocturia - Will continue to monitor  - PSA  3. Need for vaccination against Streptococcus pneumoniae  - Pneumococcal polysaccharide vaccine 23-valent greater than or equal to 2yo subcutaneous/IM   [employer],  NP

## 2017-12-09 NOTE — Patient Instructions (Signed)
It was great seeing you today!   You have received your flu vaccination and pneumonia vaccination   We will follow up with you regarding your blood work   Best of luck on your bike ride next year   Please let me know if you need anything

## 2017-12-10 ENCOUNTER — Encounter: Payer: Self-pay | Admitting: Adult Health

## 2018-03-16 DIAGNOSIS — M65839 Other synovitis and tenosynovitis, unspecified forearm: Secondary | ICD-10-CM | POA: Diagnosis not present

## 2018-04-19 ENCOUNTER — Encounter: Payer: Self-pay | Admitting: Adult Health

## 2018-04-20 DIAGNOSIS — D1801 Hemangioma of skin and subcutaneous tissue: Secondary | ICD-10-CM | POA: Diagnosis not present

## 2018-04-20 DIAGNOSIS — D2271 Melanocytic nevi of right lower limb, including hip: Secondary | ICD-10-CM | POA: Diagnosis not present

## 2018-04-20 DIAGNOSIS — L300 Nummular dermatitis: Secondary | ICD-10-CM | POA: Diagnosis not present

## 2018-04-20 DIAGNOSIS — L738 Other specified follicular disorders: Secondary | ICD-10-CM | POA: Diagnosis not present

## 2018-04-20 DIAGNOSIS — D2262 Melanocytic nevi of left upper limb, including shoulder: Secondary | ICD-10-CM | POA: Diagnosis not present

## 2018-04-20 DIAGNOSIS — L821 Other seborrheic keratosis: Secondary | ICD-10-CM | POA: Diagnosis not present

## 2018-04-20 DIAGNOSIS — D2272 Melanocytic nevi of left lower limb, including hip: Secondary | ICD-10-CM | POA: Diagnosis not present

## 2018-04-20 DIAGNOSIS — L814 Other melanin hyperpigmentation: Secondary | ICD-10-CM | POA: Diagnosis not present

## 2018-05-18 ENCOUNTER — Encounter: Payer: Self-pay | Admitting: Adult Health

## 2018-05-22 ENCOUNTER — Other Ambulatory Visit: Payer: Self-pay

## 2018-05-22 ENCOUNTER — Encounter: Payer: Self-pay | Admitting: Family Medicine

## 2018-05-22 ENCOUNTER — Ambulatory Visit (INDEPENDENT_AMBULATORY_CARE_PROVIDER_SITE_OTHER): Payer: Medicare Other | Admitting: Family Medicine

## 2018-05-22 DIAGNOSIS — J309 Allergic rhinitis, unspecified: Secondary | ICD-10-CM | POA: Diagnosis not present

## 2018-05-22 DIAGNOSIS — E782 Mixed hyperlipidemia: Secondary | ICD-10-CM | POA: Diagnosis not present

## 2018-05-22 DIAGNOSIS — R351 Nocturia: Secondary | ICD-10-CM | POA: Diagnosis not present

## 2018-05-22 DIAGNOSIS — N401 Enlarged prostate with lower urinary tract symptoms: Secondary | ICD-10-CM

## 2018-05-22 NOTE — Progress Notes (Signed)
Virtual Visit via Video Note  I connected with@ on 05/22/18 at  9:00 AM EDT by a video enabled telemedicine application and verified that I am speaking with the correct person using two identifiers.  Location patient: home Location provider:work or home office Persons participating in the virtual visit: patient, provider  I discussed the limitations of evaluation and management by telemedicine and the availability of in person appointments. The patient expressed understanding and agreed to proceed.   Jacob [name]. DOB: 1950-03-31 Encounter date: 05/22/2018  This is a 68 y.o. male who presents to establish care. Chief Complaint  Patient presents with  . Transitions Of Care    History of present illness: Generally feels that health is really good. Keeps annual well visit. Follows with derm yearly. Follows with optho twice yearly due to family hx of glaucoma.  Aches/pains related to exercise generally. Has some tendonitis in hands; triggered by using something like hand shears in garden.   After bloodwork in November; plan was for Al to work on dietary and lifestyle changes to get cholesterol down. States that diet and exercise are going well. Typically consumes 2100 cal/day 50% carb, 30% fat, rest protein. Has followed this. Thinks he is down about 4lbs from last visit. Exercise level starts to increase this time of year - golfing (walks), cycles (raises money for [employer]). Wanted to do a recheck mid-late summer.   Hyperlipidemia: Lipid Panel     Component Value Date/Time   CHOL 222 (H) 12/09/2017 0942   TRIG 78.0 12/09/2017 0942   HDL 63.00 12/09/2017 0942   CHOLHDL 4 12/09/2017 0942   VLDL 15.6 12/09/2017 0942   LDLCALC 143 (H) 12/09/2017 0942   LDLDIRECT 133.4 09/20/2011 0916    Allergic rhinitis: Has had a harder time in last month with these. Flonase helps. Uses eye drops more often. If outdoors for awhile tries to shower and get pollen off. Starting to improve now that tree  pollen is decreasing.   Degen dis disease cervical spine:Doesn't bother him on "serious" basis. Has continued to do all stretching exercises that PT prescribed in past.   BPH with nocturia: gets up once at night to urinate. As he has improved intake of water, he makes sure not to drink late in evening. Does empty bladder completely when he goes. Urine stream is ok. Stream has improved with increased water intake.      Past Medical History:  Diagnosis Date  . BPH associated with nocturia   . Hyperlipidemia    Past Surgical History:  Procedure Laterality Date  . APPENDECTOMY  1973  . KNEE ARTHROSCOPY Left    x2  . ROTATOR CUFF REPAIR Bilateral    No Known Allergies Current Meds  Medication Sig  . cetirizine (ZYRTEC) 10 MG tablet Take 10 mg by mouth daily.  . fluticasone (FLONASE ALLERGY RELIEF) 50 MCG/ACT nasal spray Place 2 sprays into both nostrils daily. OTC Medication  . levocetirizine (XYZAL) 5 MG tablet Take 5 mg by mouth every evening.   Social History   Tobacco Use  . Smoking status: Never Smoker  . Smokeless tobacco: Never Used  Substance Use Topics  . Alcohol use: Yes    Alcohol/week: 7.0 standard drinks    Types: 7 Standard drinks or equivalent per week    Comment: 2 glasses wine nightly; 4 shot liquor   Family History  Problem Relation Age of Onset  . Sarcoidosis Mother        lung  . Heart failure  Father 98  . Heart attack Maternal Grandfather 60  . Other Paternal Grandfather 63  . Diabetes Neg Hx      Review of Systems  Constitutional: Negative for chills, fatigue and fever.  Respiratory: Negative for cough, chest tightness, shortness of breath and wheezing.   Cardiovascular: Negative for chest pain, palpitations and leg swelling.    Objective:  There were no vitals taken for this visit.      BP Readings from Last 3 Encounters:  12/09/17 130/82  11/30/16 130/90  11/06/15 140/90   Wt Readings from Last 3 Encounters:  12/09/17 186 lb 12.8 oz  (84.7 kg)  11/30/16 188 lb (85.3 kg)  11/06/15 188 lb (85.3 kg)    EXAM:  GENERAL: alert, oriented, appears well and in no acute distress  HEENT: atraumatic, conjunctiva clear, no obvious abnormalities on inspection of external nose and ears  NECK: normal movements of the head and neck  LUNGS: on inspection no signs of respiratory distress, breathing rate appears normal, no obvious gross SOB, gasping or wheezing  CV: no obvious cyanosis  MS: moves all visible extremities without noticeable abnormality  PSYCH/NEURO: pleasant and cooperative, no obvious depression or anxiety, speech and thought processing grossly intact  Assessment/Plan  1. Allergic rhinitis, unspecified seasonality, unspecified trigger Stable; continue OTC medications prn.  2. Mixed hyperlipidemia Will plan to recheck in summer to note improvement with diet/exercise  3. BPH associated with nocturia Stable.    Return summer bloodwork; and CCV.   I discussed the assessment and treatment plan with the patient. The patient was provided an opportunity to ask questions and all were answered. The patient agreed with the plan and demonstrated an understanding of the instructions.   The patient was advised to call back or seek an in-person evaluation if the symptoms worsen or if the condition fails to improve as anticipated.    Micheline Rough, MD

## 2018-06-27 ENCOUNTER — Encounter: Payer: Self-pay | Admitting: Family Medicine

## 2018-06-27 DIAGNOSIS — M25532 Pain in left wrist: Secondary | ICD-10-CM

## 2018-06-28 NOTE — Telephone Encounter (Signed)
Due to timeline of pain; I would suggest ortho ref. Within ortho group they may defer him to hand specialist which would be appropriate. [place] for referral if desired. Not sure if he wants names ahead of time to select specific doc; typically I would recommend [facility].

## 2018-07-01 ENCOUNTER — Encounter: Payer: Self-pay | Admitting: Family Medicine

## 2018-07-01 DIAGNOSIS — M2550 Pain in unspecified joint: Secondary | ICD-10-CM

## 2018-07-03 ENCOUNTER — Ambulatory Visit (INDEPENDENT_AMBULATORY_CARE_PROVIDER_SITE_OTHER): Payer: Medicare Other | Admitting: Family Medicine

## 2018-07-03 ENCOUNTER — Ambulatory Visit: Payer: Self-pay | Admitting: *Deleted

## 2018-07-03 ENCOUNTER — Other Ambulatory Visit: Payer: Medicare Other

## 2018-07-03 ENCOUNTER — Other Ambulatory Visit: Payer: Self-pay

## 2018-07-03 ENCOUNTER — Telehealth: Payer: Self-pay

## 2018-07-03 DIAGNOSIS — R509 Fever, unspecified: Secondary | ICD-10-CM

## 2018-07-03 DIAGNOSIS — R6889 Other general symptoms and signs: Secondary | ICD-10-CM | POA: Diagnosis not present

## 2018-07-03 DIAGNOSIS — M2550 Pain in unspecified joint: Secondary | ICD-10-CM

## 2018-07-03 DIAGNOSIS — Z20822 Contact with and (suspected) exposure to covid-19: Secondary | ICD-10-CM

## 2018-07-03 NOTE — Progress Notes (Signed)
Patient ID: Jacob Bowens., male   DOB: Dec 07, 1950, 68 y.o.   MRN: [id number]  This visit type was conducted due to national recommendations for restrictions regarding the COVID-19 pandemic in an effort to limit this patient's exposure and mitigate transmission in our community.   Virtual Visit via Video Note  I connected with Jacob [name] on 07/03/18 at  1:30 PM EDT by a video enabled telemedicine application and verified that I am speaking with the correct person using two identifiers.  Location patient: home Location provider:work or home office Persons participating in the virtual visit: patient, provider  I discussed the limitations of evaluation and management by telemedicine and the availability of in person appointments. The patient expressed understanding and agreed to proceed.   HPI: Patient had called with complaints of some fever and generalized weakness over the weekend.  Generally very active and healthy.  Takes no regular medications.  He states that last Wednesday he did yoga and then Thursday to 13 mile bike ride.  Friday he noticed some soreness and stiffness generally along with some mild body aches.  By Friday night he had a fever 100.7.  This eventually climbed to 101.0.  He took some Tylenol and by Saturday morning he was afebrile.  He has continued to have some aches but especially has had fairly severe aching in the right ankle and somewhat left ankle, and both wrist as well.  He denies any urinary symptoms.  No cough.  No dyspnea.  No abdominal pain.  Appetite and weight are stable.  No recent tick bites.  No skin rash.  No headaches.  No vomiting or diarrhea.  No adenopathy.   ROS: See pertinent positives and negatives per HPI.  Past Medical History:  Diagnosis Date  . BPH associated with nocturia   . Hyperlipidemia     Past Surgical History:  Procedure Laterality Date  . APPENDECTOMY  1973  . KNEE ARTHROSCOPY Left    x2  . ROTATOR CUFF REPAIR Bilateral      Family History  Problem Relation Age of Onset  . Sarcoidosis Mother        lung  . Heart failure Father 90  . Heart attack Maternal Grandfather 60  . Other Paternal Grandfather 25  . Diabetes Neg Hx     SOCIAL HX: Non-smoker   Current Outpatient Medications:  .  cetirizine (ZYRTEC) 10 MG tablet, Take 10 mg by mouth daily., Disp: , Rfl:  .  fluticasone (FLONASE ALLERGY RELIEF) 50 MCG/ACT nasal spray, Place 2 sprays into both nostrils daily. OTC Medication, Disp: , Rfl:  .  levocetirizine (XYZAL) 5 MG tablet, Take 5 mg by mouth every evening., Disp: , Rfl:   EXAM:  VITALS per patient if applicable:  GENERAL: alert, oriented, appears well and in no acute distress  HEENT: atraumatic, conjunttiva clear, no obvious abnormalities on inspection of external nose and ears  NECK: normal movements of the head and neck  LUNGS: on inspection no signs of respiratory distress, breathing rate appears normal, no obvious gross SOB, gasping or wheezing  CV: no obvious cyanosis  MS: moves all visible extremities without noticeable abnormality  PSYCH/NEURO: pleasant and cooperative, no obvious depression or anxiety, speech and thought processing grossly intact  ASSESSMENT AND PLAN:  Discussed the following assessment and plan:  Acute onset over the weekend of fever and generalized weakness and body aches.  He does not have any cough currently or dyspnea but needs coronavirus screening  -If above negative need  to rule out causes for acute inflammatory arthritis. -Recommend bringing back for further labs with CBC, CMP, antinuclear antibody, rheumatoid factor, CCP antibody, sed rate, CRP, UA -Follow-up immediately for any shortness of breath or other concerning new symptoms    I discussed the assessment and treatment plan with the patient. The patient was provided an opportunity to ask questions and all were answered. The patient agreed with the plan and demonstrated an understanding of  the instructions.   The patient was advised to call back or seek an in-person evaluation if the symptoms worsen or if the condition fails to improve as anticipated.   Carolann Littler, MD

## 2018-07-03 NOTE — Telephone Encounter (Signed)
FYI

## 2018-07-03 NOTE — Telephone Encounter (Signed)
Patient is calling to report that he had fever over the weekend with fatigue and joint pain. The fever has come down- but he wants t touch base with PCP.  Reason for Disposition . HIGH RISK patient (e.g., age > 42 years, diabetes, heart or lung disease, weak immune system)  Answer Assessment - Initial Assessment Questions 1. COVID-19 DIAGNOSIS: "Who made your Coronavirus (COVID-19) diagnosis?" "Was it confirmed by a positive lab test?" If not diagnosed by a HCP, ask "Are there lots of cases (community spread) where you live?" (See public health department website, if unsure)   * MAJOR community spread: high number of cases; numbers of cases are increasing; many people hospitalized.   * MINOR community spread: low number of cases; not increasing; few or no people hospitalized     minor 2. ONSET: "When did the COVID-19 symptoms start?"      Friday evening 7:30 3. WORST SYMPTOM: "What is your worst symptom?" (e.g., cough, fever, shortness of breath, muscle aches)     Patient is better- aches- weakness- general fatigue 4. COUGH: "Do you have a cough?" If so, ask: "How bad is the cough?"       No cough 5. FEVER: "Do you have a fever?" If so, ask: "What is your temperature, how was it measured, and when did it start?"     Friday patient had fever-101.7 6. RESPIRATORY STATUS: "Describe your breathing?" (e.g., shortness of breath, wheezing, unable to speak)      no 7. BETTER-SAME-WORSE: "Are you getting better, staying the same or getting worse compared to yesterday?"  If getting worse, ask, "In what way?"     Same- slightly better- little less discomfort 8. HIGH RISK DISEASE: "Do you have any chronic medical problems?" (e.g., asthma, heart or lung disease, weak immune system, etc.)     no 9. PREGNANCY: "Is there any chance you are pregnant?" "When was your last menstrual period?"     n/a 10. OTHER SYMPTOMS: "Do you have any other symptoms?"  (e.g., runny nose, headache, sore throat, loss of  smell)       weakness  Protocols used: CORONAVIRUS (COVID-19) DIAGNOSED OR SUSPECTED-A-AH

## 2018-07-03 NOTE — Telephone Encounter (Signed)
Dr. Ethlyn Gallery requests COVID 19 test.

## 2018-07-03 NOTE — Telephone Encounter (Signed)
Pt has app scheduled for today

## 2018-07-04 LAB — NOVEL CORONAVIRUS, NAA: SARS-CoV-2, NAA: NOT DETECTED

## 2018-07-05 NOTE — Telephone Encounter (Signed)
Sounds more inflammation than infection; and since COVID testing was negative, would consider additional evaluation.   If never had anything like this before, we can look into specific things that would inflame joints (gout, autoimmune inflamm conditions, tick bite associated). I have put in order for bloodwork. If doing better and wants to wait for ortho, that is ok, but these are ordered if he wants to complete.   Make sure no skin rashes.

## 2018-07-06 ENCOUNTER — Other Ambulatory Visit (INDEPENDENT_AMBULATORY_CARE_PROVIDER_SITE_OTHER): Payer: Medicare Other

## 2018-07-06 ENCOUNTER — Telehealth: Payer: Self-pay | Admitting: *Deleted

## 2018-07-06 ENCOUNTER — Encounter: Payer: Self-pay | Admitting: Family Medicine

## 2018-07-06 ENCOUNTER — Other Ambulatory Visit: Payer: Self-pay

## 2018-07-06 DIAGNOSIS — M2550 Pain in unspecified joint: Secondary | ICD-10-CM | POA: Diagnosis not present

## 2018-07-06 LAB — COMPREHENSIVE METABOLIC PANEL
ALT: 17 U/L (ref 0–53)
AST: 14 U/L (ref 0–37)
Albumin: 4.2 g/dL (ref 3.5–5.2)
Alkaline Phosphatase: 50 U/L (ref 39–117)
BUN: 17 mg/dL (ref 6–23)
CO2: 30 mEq/L (ref 19–32)
Calcium: 9.5 mg/dL (ref 8.4–10.5)
Chloride: 102 mEq/L (ref 96–112)
Creatinine, Ser: 1.06 mg/dL (ref 0.40–1.50)
GFR: 69.1 mL/min (ref >60.00)
Glucose, Bld: 85 mg/dL (ref 70–99)
Potassium: 4.7 mEq/L (ref 3.5–5.1)
Sodium: 141 mEq/L (ref 135–145)
Total Bilirubin: 0.5 mg/dL (ref 0.2–1.2)
Total Protein: 6.7 g/dL (ref 6.0–8.3)

## 2018-07-06 LAB — CBC WITH DIFFERENTIAL/PLATELET
Basophils Absolute: 0.1 10*3/uL (ref 0.0–0.1)
Basophils Relative: 1.2 % (ref 0.0–3.0)
Eosinophils Absolute: 0.1 10*3/uL (ref 0.0–0.7)
Eosinophils Relative: 0.7 % (ref 0.0–5.0)
HCT: 41.7 % (ref 39.0–52.0)
Hemoglobin: 14 g/dL (ref 13.0–17.0)
Lymphocytes Relative: 22.5 % (ref 12.0–46.0)
Lymphs Abs: 1.7 10*3/uL (ref 0.7–4.0)
MCHC: 33.7 g/dL (ref 30.0–36.0)
MCV: 91.4 fl (ref 78.0–100.0)
Monocytes Absolute: 0.7 10*3/uL (ref 0.1–1.0)
Monocytes Relative: 9.7 % (ref 3.0–12.0)
Neutro Abs: 4.9 10*3/uL (ref 1.4–7.7)
Neutrophils Relative %: 65.9 % (ref 43.0–77.0)
Platelets: 265 10*3/uL (ref 150.0–400.0)
RBC: 4.56 Mil/uL (ref 4.22–5.81)
RDW: 14 % (ref 11.5–15.5)
WBC: 7.4 10*3/uL (ref 4.0–10.5)

## 2018-07-06 LAB — C-REACTIVE PROTEIN: CRP: 5.9 mg/dL (ref 0.5–20.0)

## 2018-07-06 LAB — SEDIMENTATION RATE: Sed Rate: 46 mm/hr — ABNORMAL HIGH (ref 0–20)

## 2018-07-06 LAB — URIC ACID: Uric Acid, Serum: 5.9 mg/dL (ref 4.0–7.8)

## 2018-07-06 NOTE — Telephone Encounter (Signed)
Sounds good. Please offer him virtual visit if there is available provider today or perhaps schedule follow up virtual tomorrow (or in office) for followup after bloodwork if he still has concerns. I still have not seen him for any of these issues. From message I got just minutes before your call he sounded very concerned that things were not improving? Just want to make sure we are addressing swelling/redness if still there (your note made it sound better but his message did not).

## 2018-07-06 NOTE — Telephone Encounter (Signed)
See prior phone note attached to [place] message.

## 2018-07-06 NOTE — Telephone Encounter (Signed)
Copied from [place] [id number]. Topic: General - Other >> Jul 06, 2018  9:30 AM Celene Kras A wrote: Reason for CRM: PT called regarding his ankle. Pt states he was supposed to get a call from Pricilla Holm yesterday. Please advise at [phone]

## 2018-07-06 NOTE — Telephone Encounter (Signed)
I called the pt and informed him of the message below.  I offered an appt with another provider tomorrow as Dr Ethlyn Gallery does not have any openings.  Patient stated he prefers to await the results and an appt was scheduled on 6/8 at 10:30am per pts preference to see Dr Ethlyn Gallery only.

## 2018-07-06 NOTE — Telephone Encounter (Signed)
I called the pt and informed him of the message below.  Patient stated he would prefer to have labs done and an appt was scheduled for today.  Denies a rash; however stated he does recall having what he thought was a spider bite on the right lateral ankle and left medial ankle approximately 6-8 weeks ago.  States itching and redness resolved after using Cortisone 10.  Message sent to Dr Ethlyn Gallery as Juluis Rainier.

## 2018-07-07 ENCOUNTER — Encounter: Payer: Self-pay | Admitting: Adult Health

## 2018-07-07 ENCOUNTER — Ambulatory Visit (INDEPENDENT_AMBULATORY_CARE_PROVIDER_SITE_OTHER): Payer: Medicare Other | Admitting: Adult Health

## 2018-07-07 DIAGNOSIS — M25571 Pain in right ankle and joints of right foot: Secondary | ICD-10-CM

## 2018-07-07 MED ORDER — DOXYCYCLINE HYCLATE 100 MG PO CAPS: 100.0000 mg | ORAL_CAPSULE | Freq: Two times a day (BID) | ORAL | 0 refills | Status: DC

## 2018-07-07 MED ORDER — PREDNISONE 10 MG PO TABS: ORAL_TABLET | ORAL | 0 refills | Status: DC

## 2018-07-07 NOTE — Progress Notes (Signed)
Virtual Visit via Video Note  I connected with Jacob [name]  on 07/07/18 at  4:00 PM EDT by a video enabled telemedicine application and verified that I am speaking with the correct person using two identifiers.  Location patient: home Location provider:work or home office Persons participating in the virtual visit: patient, provider  I discussed the limitations of evaluation and management by telemedicine and the availability of in person appointments. The patient expressed understanding and agreed to proceed.   HPI: 68 year old male who presents for evaluation today regarding right ankle pain.  Was originally seen on 07/03/2018 by another provider for fairly severe aching in the right ankle, somewhat in the left ankle and both wrists as well.  He had a low-grade fever up to 100.7 which eventually climbed to 101.0 he was swabbed for COVID which was negative.  Today his PCP ordered labs including a CBC, CMP, ANA, C-reactive protein, sed rate, uric acid level, rheumatoid factor.  His labs were negative except for a mildly elevated sed rate.  Lately the pain and swelling in both wrists have since resolved but he continues to have severe pain in the right ankle, over the last 24 to 48 hours he reports that the right ankle has become warm and tender to touch.  He has been using over-the-counter Tylenol Motrin which helps dull the pain but does not relieve it.  The pain two days ago was so severe that he could not stand   ROS: See pertinent positives and negatives per HPI.  Past Medical History:  Diagnosis Date  . BPH associated with nocturia   . Hyperlipidemia     Past Surgical History:  Procedure Laterality Date  . APPENDECTOMY  1973  . KNEE ARTHROSCOPY Left    x2  . ROTATOR CUFF REPAIR Bilateral     Family History  Problem Relation Age of Onset  . Sarcoidosis Mother        lung  . Heart failure Father 60  . Heart attack Maternal Grandfather 60  . Other Paternal Grandfather 43  .  Diabetes Neg Hx      Current Outpatient Medications:  .  cetirizine (ZYRTEC) 10 MG tablet, Take 10 mg by mouth daily., Disp: , Rfl:  .  doxycycline (VIBRAMYCIN) 100 MG capsule, Take 1 capsule (100 mg total) by mouth 2 (two) times daily., Disp: 14 capsule, Rfl: 0 .  fluticasone (FLONASE ALLERGY RELIEF) 50 MCG/ACT nasal spray, Place 2 sprays into both nostrils daily. OTC Medication, Disp: , Rfl:  .  levocetirizine (XYZAL) 5 MG tablet, Take 5 mg by mouth every evening., Disp: , Rfl:  .  predniSONE (DELTASONE) 10 MG tablet, 40 mg x 3 days, 20 mg x 3 days, 10 mg x 3 days, Disp: 21 tablet, Rfl: 0  EXAM:  VITALS per patient if applicable:  GENERAL: alert, oriented, appears well and in no acute distress  HEENT: atraumatic, conjunttiva clear, no obvious abnormalities on inspection of external nose and ears  NECK: normal movements of the head and neck  LUNGS: on inspection no signs of respiratory distress, breathing rate appears normal, no obvious gross SOB, gasping or wheezing  CV: no obvious cyanosis  MS: moves all visible extremities without noticeable abnormality  PSYCH/NEURO: pleasant and cooperative, no obvious depression or anxiety, speech and thought processing grossly intact  SKIN: Noticeable swelling and redness along lateral aspect of right ankle  ASSESSMENT AND PLAN:  Discussed the following assessment and plan:  Gout vs. Cellulitis.  Will treat for  both with doxycycline and a prednisone taper.  He was advised to follow-up with his PCP if no improvement within the next 2 to 3 days  Acute right ankle pain - Plan: doxycycline (VIBRAMYCIN) 100 MG capsule, predniSONE (DELTASONE) 10 MG tablet     I discussed the assessment and treatment plan with the patient. The patient was provided an opportunity to ask questions and all were answered. The patient agreed with the plan and demonstrated an understanding of the instructions.   The patient was advised to call back or seek an  in-person evaluation if the symptoms worsen or if the condition fails to improve as anticipated.   Dorothyann Peng, NP

## 2018-07-07 NOTE — Telephone Encounter (Signed)
Are there openings for virtual visit for him? (or even in office). I think it would be best to have visit to look at swelling/exam.

## 2018-07-07 NOTE — Telephone Encounter (Signed)
Pt called back and stated that he has a follow up question to his my chart message.  He would not provide any other info just requesting a call back from [place] number  [phone]

## 2018-07-07 NOTE — Telephone Encounter (Signed)
I called the pt and scheduled a virtual visit for today with [facility].

## 2018-07-08 LAB — ANA: Anti Nuclear Antibody (ANA): NEGATIVE

## 2018-07-08 LAB — RHEUMATOID FACTOR: Rheumatoid fact SerPl-aCnc: <14 IU/mL (ref <14)

## 2018-07-10 ENCOUNTER — Ambulatory Visit (INDEPENDENT_AMBULATORY_CARE_PROVIDER_SITE_OTHER): Payer: Medicare Other | Admitting: Family Medicine

## 2018-07-10 ENCOUNTER — Other Ambulatory Visit: Payer: Self-pay

## 2018-07-10 ENCOUNTER — Encounter: Payer: Self-pay | Admitting: Family Medicine

## 2018-07-10 DIAGNOSIS — M25571 Pain in right ankle and joints of right foot: Secondary | ICD-10-CM

## 2018-07-10 DIAGNOSIS — E782 Mixed hyperlipidemia: Secondary | ICD-10-CM | POA: Diagnosis not present

## 2018-07-10 NOTE — Progress Notes (Signed)
Virtual Visit via Video Note  I connected with Jacob [name]. on 07/10/18 at 10:30 AM EDT by a video enabled telemedicine application and verified that I am speaking with the correct person using two identifiers.  Location patient: home Location provider:work or home office Persons participating in the virtual visit: patient, provider  I discussed the limitations of evaluation and management by telemedicine and the availability of in person appointments. The patient expressed understanding and agreed to proceed.   Jacob [name]. DOB: May 20, 1950 Encounter date: 07/10/2018  This is a 68 y.o. male who presents with Chief Complaint  Patient presents with  . Follow-up    History of present illness: Saw Dr. Elease Hashimoto 6/1 w complaints fever, weakness. Coronavirus testing negative. Remained with joint pain. Visit with Tommi Rumps 6/5 due to continued ankle swelling - thought to be gout vs cellulitis. Tx with doxy and prednisone taper. Lab eval was significant only for elevated sed rate (otherwise nonspecific)  Previously wanted to recheck cholesterol in summer after working on diet  HPI  Overall he is improved from last week.  Swelling around ankle is down about 50%, although still there. Pain is also about half of what it was. More soreness and tenderness with pressure on foot rather than the piercing pain he had previously. Flexibility has improved. He has been working on gen ROM; improved about 30%. In right big toe area - stiff, tender. Might have been like that before but just not noticed due to severity of ankle. Medications seem to be working. NO problems with tolerating medications. Some difficulty with falling asleep, but once he falls asleep he is ok. Today down to 2 tabs with prednisone.   Has had tendonitis and ankle issues in past related to sprain/twists, but not had anything like this that was as inflammed as this was.   States 1 week ago Friday night - started with low grade fever.  Sat morning fever better, but general aches/pains in wrists, ankles, toes. Then swelling in right ankle. Other joints seem to be normal.   No other skin rashes. Not any sick exposures that he knew of. Did have insect bite on inside of right foot and left foot. Thought spider bite. Was certain wasn't tick. Just itched, but nothing else.   Dad had hx of arthritis - took prednisone daily for years for this. Dad said it was rheumatoid arthritis. Spoke with brother yesterday and in last few months states he had 3 episodes of gout.   [place] states no major dietary changes prior to this flare. Occasional alcohol but has really been cutting back and eating very healthy overall. More mediterranean diet style. Minimal butter, fat. Cooking with olive or canola oil.   Wonders about organic fertilizer exposure could have influenced.   Had hair cut Tuesday before symptoms started. Wondered about sick exposures or chemical exposures at that time.   Did check bp - 136/82 with HR 64; May bp was 124/82 HR 58. Weight 182 today. Temp normal 96.7.   Has questions about ongoing healing - in past ortho has prescribed warm epsom salt bath, stretches and then icing. Has been trying to stay off feet. Wondering about getting back to activity and whether this method is ok.   There is touch sensitivity around ankle in evening. Wondering about topical tx. Hemp infused, biofreeze, magna-25, epsom salt gel.   Still has appointment tomorrow for wrist.  Following with Eye doc later this week.   In general has increased exercise and  cut back on alcohol so he is working towards healthier lifestyle.   No Known Allergies Current Meds  Medication Sig  . cetirizine (ZYRTEC) 10 MG tablet Take 10 mg by mouth daily.  Marland Kitchen doxycycline (VIBRAMYCIN) 100 MG capsule Take 1 capsule (100 mg total) by mouth 2 (two) times daily.  . fluticasone (FLONASE ALLERGY RELIEF) 50 MCG/ACT nasal spray Place 2 sprays into both nostrils daily. OTC  Medication  . levocetirizine (XYZAL) 5 MG tablet Take 5 mg by mouth every evening.  . predniSONE (DELTASONE) 10 MG tablet 40 mg x 3 days, 20 mg x 3 days, 10 mg x 3 days    Review of Systems  Constitutional: Negative for chills, fatigue and fever.  Respiratory: Negative for cough, chest tightness, shortness of breath and wheezing.   Cardiovascular: Negative for chest pain, palpitations and leg swelling.  Musculoskeletal: Positive for arthralgias (see hpi).    Objective:  There were no vitals taken for this visit.      BP Readings from Last 3 Encounters:  12/09/17 130/82  11/30/16 130/90  11/06/15 140/90   Wt Readings from Last 3 Encounters:  12/09/17 186 lb 12.8 oz (84.7 kg)  11/30/16 188 lb (85.3 kg)  11/06/15 188 lb (85.3 kg)    EXAM:  GENERAL: alert, oriented, appears well and in no acute distress  HEENT: atraumatic, conjunctiva clear, no obvious abnormalities on inspection of external nose and ears  NECK: normal movements of the head and neck  LUNGS: on inspection no signs of respiratory distress, breathing rate appears normal, no obvious gross SOB, gasping or wheezing  CV: no obvious cyanosis  MS: moves all visible extremities without noticeable abnormality  PSYCH/NEURO: pleasant and cooperative, no obvious depression or anxiety, speech and thought processing grossly intact  Assessment/Plan  1. Mixed hyperlipidemia He is working on healthier lifestyle and diet.  We will plan to recheck cholesterol panel in November.  He is not opposed to statin use if needed to help control his cholesterol.  2. Acute right ankle pain Improving on doxycycline and prednisone.  Let us know if not completely resolved at the end of these medication courses.  We reviewed blood work in detail.  If any recurrence of similar episodes will need further evaluation at that time.    Return in about 5 months (around 12/10/2018).AWV/bloodwork   I discussed the assessment and treatment plan  with the patient. The patient was provided an opportunity to ask questions and all were answered. The patient agreed with the plan and demonstrated an understanding of the instructions.   The patient was advised to call back or seek an in-person evaluation if the symptoms worsen or if the condition fails to improve as anticipated.  I provided 25 minutes of non-face-to-face time during this encounter.   Micheline Rough, MD

## 2018-07-11 DIAGNOSIS — M19032 Primary osteoarthritis, left wrist: Secondary | ICD-10-CM | POA: Diagnosis not present

## 2018-07-11 DIAGNOSIS — M25532 Pain in left wrist: Secondary | ICD-10-CM | POA: Diagnosis not present

## 2018-07-11 DIAGNOSIS — M1812 Unilateral primary osteoarthritis of first carpometacarpal joint, left hand: Secondary | ICD-10-CM | POA: Diagnosis not present

## 2018-07-13 ENCOUNTER — Encounter: Payer: Self-pay | Admitting: Family Medicine

## 2018-07-13 DIAGNOSIS — H40013 Open angle with borderline findings, low risk, bilateral: Secondary | ICD-10-CM | POA: Diagnosis not present

## 2018-07-13 DIAGNOSIS — H35373 Puckering of macula, bilateral: Secondary | ICD-10-CM | POA: Diagnosis not present

## 2018-07-13 DIAGNOSIS — H2513 Age-related nuclear cataract, bilateral: Secondary | ICD-10-CM | POA: Diagnosis not present

## 2018-07-13 DIAGNOSIS — H35013 Changes in retinal vascular appearance, bilateral: Secondary | ICD-10-CM | POA: Diagnosis not present

## 2018-07-14 ENCOUNTER — Telehealth: Payer: Self-pay | Admitting: *Deleted

## 2018-07-14 NOTE — Telephone Encounter (Signed)
I left a detailed message for the pt to call back and schedule appts as below.

## 2018-07-14 NOTE — Telephone Encounter (Signed)
-----   Message from [name], MD sent at 07/10/2018  3:20 PM EDT ----- bloodwork then wellness/physical f/u in November

## 2018-07-17 ENCOUNTER — Encounter: Payer: Self-pay | Admitting: Family Medicine

## 2018-10-23 ENCOUNTER — Encounter: Payer: Self-pay | Admitting: Family Medicine

## 2018-10-24 ENCOUNTER — Other Ambulatory Visit: Payer: Self-pay

## 2018-10-24 ENCOUNTER — Ambulatory Visit (INDEPENDENT_AMBULATORY_CARE_PROVIDER_SITE_OTHER): Payer: Medicare Other

## 2018-10-24 DIAGNOSIS — Z23 Encounter for immunization: Secondary | ICD-10-CM

## 2018-10-25 DIAGNOSIS — D485 Neoplasm of uncertain behavior of skin: Secondary | ICD-10-CM | POA: Diagnosis not present

## 2018-10-25 DIAGNOSIS — R21 Rash and other nonspecific skin eruption: Secondary | ICD-10-CM | POA: Diagnosis not present

## 2018-10-25 DIAGNOSIS — L821 Other seborrheic keratosis: Secondary | ICD-10-CM | POA: Diagnosis not present

## 2018-10-25 DIAGNOSIS — L570 Actinic keratosis: Secondary | ICD-10-CM | POA: Diagnosis not present

## 2018-11-05 ENCOUNTER — Encounter: Payer: Self-pay | Admitting: Family Medicine

## 2018-11-06 ENCOUNTER — Other Ambulatory Visit: Payer: Self-pay | Admitting: Family Medicine

## 2018-12-06 ENCOUNTER — Other Ambulatory Visit: Payer: Self-pay

## 2018-12-06 ENCOUNTER — Ambulatory Visit (INDEPENDENT_AMBULATORY_CARE_PROVIDER_SITE_OTHER): Payer: Medicare Other | Admitting: Family Medicine

## 2018-12-06 ENCOUNTER — Encounter: Payer: Self-pay | Admitting: Family Medicine

## 2018-12-06 VITALS — BP 126/87 | HR 74 | Temp 98.0°F | Ht 70.25 in | Wt 184.9 lb

## 2018-12-06 DIAGNOSIS — N401 Enlarged prostate with lower urinary tract symptoms: Secondary | ICD-10-CM

## 2018-12-06 DIAGNOSIS — R351 Nocturia: Secondary | ICD-10-CM

## 2018-12-06 DIAGNOSIS — J309 Allergic rhinitis, unspecified: Secondary | ICD-10-CM

## 2018-12-06 DIAGNOSIS — E782 Mixed hyperlipidemia: Secondary | ICD-10-CM | POA: Diagnosis not present

## 2018-12-06 DIAGNOSIS — Z0000 Encounter for general adult medical examination without abnormal findings: Secondary | ICD-10-CM | POA: Diagnosis not present

## 2018-12-06 NOTE — Progress Notes (Signed)
Patient: Jacob [name]., Male    DOB: [birth year], 67 y.o.   MRN: [id number] Visit Date: 12/07/2018  Today's Provider: Micheline Rough, MD   Chief Complaint  Patient presents with  . Annual Exam    patient presents for Medicare Wellness Visit   Subjective:   Initial preventative physical exam  Jacob [name].  is a 68 y.o. male who presents today for his Initial Preventative Physical Exam. He feels well. He reports exercising daily. He reports he is sleeping fairly well. More wakeful during political season.   HPI  Last visits were around June.  Patient had issues with fever and weakness followed by joint pain of the ankle thought to be gout versus cellulitis.  Improved with doxycycline and prednisone. Has been able to go back to all of regular activities. No additional issues or new joint pains.   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   Since here last had growth removed on arm by Dr. Ubaldo Glassing.  Follows with Dr. Herbert Deaner for ophtho needs.   Allergic rhinitis: had more allergy issues this year than in years past. Had always been resistant to testing in past, but starting to think about this. Stopped taking flonase and switched to claritin. Switches if noting that he isn't getting improvement with one. Feels like post nasal drainage is better just with switching to claritin. Would consider seeing allergy if not getting relief with flonase and claritin. Also got air scrubber machine to clean air in home which he thinks is going to help.   Living arrangements - the patient lives with their spouse.  Hearing screen: completed (see below)  Vision screen: completed by Dr. Herbert Deaner I June 2020.  Advance Directives/Living Will: does have living will; suggested bringing in copy to put on file.   120-128/80-88 when checking blood pressures at home.   Review of Systems  Constitutional: Negative for activity change, appetite change, chills, fatigue,  fever and unexpected weight change.  HENT: Negative for congestion, ear pain, hearing loss, sinus pressure, sinus pain, sore throat and trouble swallowing.   Eyes: Negative for pain and visual disturbance.  Respiratory: Negative for cough, chest tightness, shortness of breath and wheezing.   Cardiovascular: Negative for chest pain, palpitations and leg swelling.  Gastrointestinal: Negative for abdominal distention, abdominal pain, blood in stool, constipation, diarrhea, nausea and vomiting.  Genitourinary: Negative for decreased urine volume (usually urinates just once at night), difficulty urinating (good stream), dysuria, frequency, penile pain and testicular pain.  Musculoskeletal: Negative for arthralgias, back pain and joint swelling.  Skin: Negative for rash.  Neurological: Negative for dizziness, weakness, numbness and headaches.  Hematological: Negative for adenopathy. Does not bruise/bleed easily.  Psychiatric/Behavioral: Negative for agitation, sleep disturbance and suicidal ideas. The patient is not nervous/anxious.     Social History   Socioeconomic History  . Marital status: Married    Spouse name: Not on file  . Number of children: Not on file  . Years of education: Not on file  . Highest education level: Not on file  Occupational History  . Not on file  Social Needs  . Financial resource strain: Not on file  . Food insecurity    Worry: Not on file    Inability: Not on file  . Transportation needs    Medical: Not on file    Non-medical: Not on file  Tobacco Use  . Smoking status: Never Smoker  . Smokeless tobacco: Never Used  Substance and Sexual Activity  . Alcohol use: Yes    Alcohol/week: 7.0 standard drinks    Types: 7 Standard drinks or equivalent per week    Comment: 2 glasses wine nightly; 4 shot liquor  . Drug use: No  . Sexual activity: Not on file  Lifestyle  . Physical activity    Days per week: Not on file    Minutes per session: Not on file  .  Stress: Not on file  Relationships  . Social Herbalist on phone: Not on file    Gets together: Not on file    Attends religious service: Not on file    Active member of club or organization: Not on file    Attends meetings of clubs or organizations: Not on file    Relationship status: Not on file  . Intimate partner violence    Fear of current or ex partner: Not on file    Emotionally abused: Not on file    Physically abused: Not on file    Forced sexual activity: Not on file  Other Topics Concern  . Not on file  Social History Narrative  . Not on file    Patient Active Problem List   Diagnosis Date Noted  . BPH associated with nocturia 11/05/2014  . DEGENERATIVE DISC DISEASE, CERVICAL SPINE, W/RADICULOPATHY 03/18/2009  . Hyperlipidemia 08/15/2006  . Allergic rhinitis 08/15/2006    Past Surgical History:  Procedure Laterality Date  . APPENDECTOMY  1973  . KNEE ARTHROSCOPY Left    x2  . ROTATOR CUFF REPAIR Bilateral     His family history includes Gout in his brother; Heart attack (age of onset: 42) in his maternal grandfather; Heart failure (age of onset: 11) in his father; Other (age of onset: 24) in his paternal grandfather; Rheum arthritis in his father; Sarcoidosis in his mother.     Previous Medications   LORATADINE (CLARITIN) 10 MG TABLET    Take 10 mg by mouth daily.   MULTIPLE VITAMINS-MINERALS (MULTIVITAMIN ADULTS PO)    Take by mouth.   PROBIOTIC PRODUCT (PROBIOTIC PO)    Take by mouth daily.    Patient Care Team: [name], MD as PCP - General (Family Medicine) [name], MD as Consulting Physician (Dermatology) Monna Fam, MD as Consulting Physician (Ophthalmology)      Objective:   Vitals: BP 126/87 (BP Location: Left Arm, Patient Position: Sitting, Cuff Size: Large)   Pulse 74   Temp 98 F (36.7 C) (Temporal)   Ht 5' 10.25" (1.784 m)   Wt 184 lb 14.4 oz (83.9 kg)   SpO2 98%   BMI 26.34 kg/m   Physical  Exam Constitutional:      General: He is not in acute distress.    Appearance: He is well-developed.  HENT:     Head: Normocephalic and atraumatic.     Right Ear: External ear normal.     Left Ear: External ear normal.     Nose: Nose normal.     Mouth/Throat:     Pharynx: No oropharyngeal exudate.  Eyes:     Conjunctiva/sclera: Conjunctivae normal.     Pupils: Pupils are equal, round, and reactive to light.  Neck:     Musculoskeletal: Neck supple.     Thyroid: No thyromegaly.  Cardiovascular:     Rate and Rhythm: Normal rate and regular rhythm.     Heart sounds: Normal heart sounds. No murmur. No friction rub. No gallop.  Pulmonary:     Effort: Pulmonary effort is normal. No respiratory distress.     Breath sounds: Normal breath sounds. No stridor. No wheezing or rales.  Abdominal:     General: Bowel sounds are normal.     Palpations: Abdomen is soft.  Genitourinary:    Prostate: Enlarged (4.5). Not tender and no nodules present.  Musculoskeletal: Normal range of motion.  Skin:    General: Skin is warm and dry.  Neurological:     Mental Status: He is alert and oriented to person, place, and time.  Psychiatric:        Behavior: Behavior normal.        Thought Content: Thought content normal.        Judgment: Judgment normal.       Hearing Screening   125Hz  250Hz  500Hz  1000Hz  2000Hz  3000Hz  4000Hz  6000Hz  8000Hz   Right ear:   Pass Pass Pass  Pass    Left ear:   Pass Pass Pass        Activities of Daily Living In your present state of health, do you have any difficulty performing the following activities: 12/09/2017  Hearing? N  Vision? N  Difficulty concentrating or making decisions? N  Walking or climbing stairs? N  Dressing or bathing? N  Doing errands, shopping? N  Some recent data might be hidden    Fall Risk Assessment Fall Risk  12/06/2018 12/09/2017 11/30/2016 11/06/2015 11/05/2014  Falls in the past year? 0 0 No No No  Number falls in past yr: 0 0 - - -   Injury with Fall? 0 0 - - -     Patient reports there are not safety devices in place in shower at home.   Depression Screen PHQ 2/9 Scores 12/06/2018 12/09/2017 11/30/2016 11/06/2015  PHQ - 2 Score 0 0 0 0    Cognitive Testing - 6-CIT   Correct? Score   What year is it? yes 0 Yes = 0    No = 4  What month is it? yes 0 Yes = 0    No = 3  Remember:     [address]ia [address], [place], [place]     What time is it? yes 0 Yes = 0    No = 3  Count backwards from 20 to 1 yes 0 Correct = 0    1 error = 2   More than 1 error = 4  Say the months of the year in reverse. yes 0 Correct = 0    1 error = 2   More than 1 error = 4  What address did I ask you to remember? yes 0 Correct = 0  1 error = 2    2 error = 4    3 error = 6    4 error = 8    All wrong = 10       TOTAL SCORE  0/28   Interpretation:  Normal  Normal (0-7) Abnormal (8-28)    Health Maintenance  Topic Date Due  . COLONOSCOPY  02/27/2022 (Originally 02/01/2022)  . TETANUS/TDAP  04/18/2023  . INFLUENZA VACCINE  Completed  . Hepatitis C Screening  Completed  . PNA vac Low Risk Adult  Completed    Assessment & Plan:     Initial Preventative Physical Exam  Reviewed patient's Family Medical History Reviewed and updated list of patient's medical providers Assessment of cognitive impairment was done Assessed patient's functional ability Established a written schedule for  health screening [place] Completed and Reviewed  Exercise Activities and Dietary recommendations Goals   None     Immunization History  Administered Date(s) Administered  . Fluad Quad(high Dose 65+) 10/24/2018  . Influenza Split 10/12/2011  . Influenza Whole 11/01/2008  . Influenza, High Dose Seasonal PF 11/05/2014, 12/02/2015, 11/30/2016, 12/09/2017  . Influenza,inj,Quad PF,6+ Mos 02/12/2013  . Pneumococcal Conjugate-13 11/30/2016  . Pneumococcal Polysaccharide-23 12/09/2017  . Td 02/01/2002  . Tdap 04/17/2013    Health  Maintenance  Topic Date Due  . COLONOSCOPY  02/27/2022 (Originally 02/01/2022)  . TETANUS/TDAP  04/18/2023  . INFLUENZA VACCINE  Completed  . Hepatitis C Screening  Completed  . PNA vac Low Risk Adult  Completed     Discussed health benefits of physical activity, and encouraged him to engage in regular exercise appropriate for his age and condition.    1. Encounter for annual wellness exam in Medicare patient He is doing very well overall.  Stays very active and is eating a healthy diet.  We are going to plan to do some blood work at his next visit.  He is up-to-date with all health maintenance items.  He will bring an updated copy of his living will.   2. Allergic rhinitis, unspecified seasonality, unspecified trigger Does well with Claritin.  We discussed using Flonase as well as Claritin to help for better allergy control overall.  Would be okay for allergy referral if he desires this in the future.  3. Mixed hyperlipidemia Has been diet and exercise controlled.  He has lost weight and is eating healthier than he was when last blood work was done.  We will recheck at next office visit (or he will call if he wants this done sooner)  4. BPH associated with nocturia Stable.   Return in about 1 year (around 12/06/2019) for physical exam. [name] MD

## 2018-12-07 ENCOUNTER — Encounter: Payer: Self-pay | Admitting: Family Medicine

## 2018-12-12 DIAGNOSIS — H40013 Open angle with borderline findings, low risk, bilateral: Secondary | ICD-10-CM | POA: Diagnosis not present

## 2018-12-12 DIAGNOSIS — H04123 Dry eye syndrome of bilateral lacrimal glands: Secondary | ICD-10-CM | POA: Diagnosis not present

## 2018-12-22 ENCOUNTER — Other Ambulatory Visit: Payer: Self-pay

## 2019-01-16 ENCOUNTER — Encounter: Payer: Self-pay | Admitting: Family Medicine

## 2019-02-01 ENCOUNTER — Encounter: Payer: Self-pay | Admitting: Family Medicine

## 2019-02-05 NOTE — Telephone Encounter (Signed)
Would be ideal to do visit in person IF we have xray available.   If he wants to avoid in office we can do virtual and I could order xray if indicated as well.

## 2019-02-05 NOTE — Telephone Encounter (Signed)
Spoke with the pt and informed him of the message below.  Offered an appt with a different provider sooner or an appt for 1/8 with Dr Ethlyn Gallery.  Patient stated he is managing his pain and will await the appt that was scheduled for 1/8 with Dr Ethlyn Gallery to arrive at 9:45am.

## 2019-02-09 ENCOUNTER — Ambulatory Visit (INDEPENDENT_AMBULATORY_CARE_PROVIDER_SITE_OTHER): Payer: Medicare Other

## 2019-02-09 ENCOUNTER — Encounter: Payer: Self-pay | Admitting: Family Medicine

## 2019-02-09 ENCOUNTER — Ambulatory Visit (INDEPENDENT_AMBULATORY_CARE_PROVIDER_SITE_OTHER): Payer: Medicare Other | Admitting: Family Medicine

## 2019-02-09 ENCOUNTER — Other Ambulatory Visit: Payer: Self-pay

## 2019-02-09 VITALS — BP 120/90 | HR 77 | Temp 97.5°F | Ht 70.25 in | Wt 188.0 lb

## 2019-02-09 DIAGNOSIS — M533 Sacrococcygeal disorders, not elsewhere classified: Secondary | ICD-10-CM | POA: Diagnosis not present

## 2019-02-09 DIAGNOSIS — M25552 Pain in left hip: Secondary | ICD-10-CM | POA: Diagnosis not present

## 2019-02-09 DIAGNOSIS — M545 Low back pain: Secondary | ICD-10-CM | POA: Diagnosis not present
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
## 2019-02-12 ENCOUNTER — Encounter: Payer: Self-pay | Admitting: Family Medicine

## 2019-02-12 NOTE — Addendum Note (Signed)
Addended by: Agnes Lawrence on: 02/12/2019 02:28 PM   Modules accepted: Orders

## 2019-02-14 ENCOUNTER — Encounter: Payer: Self-pay | Admitting: Family Medicine

## 2019-02-26 ENCOUNTER — Ambulatory Visit (INDEPENDENT_AMBULATORY_CARE_PROVIDER_SITE_OTHER): Payer: Medicare Other | Admitting: Family Medicine

## 2019-02-26 ENCOUNTER — Encounter: Payer: Self-pay | Admitting: Family Medicine

## 2019-02-26 ENCOUNTER — Other Ambulatory Visit: Payer: Self-pay

## 2019-02-26 DIAGNOSIS — M533 Sacrococcygeal disorders, not elsewhere classified: Secondary | ICD-10-CM

## 2019-02-26 DIAGNOSIS — M999 Biomechanical lesion, unspecified: Secondary | ICD-10-CM | POA: Diagnosis not present

## 2019-02-26 NOTE — Assessment & Plan Note (Signed)
Sacroiliac dysfunction.  Discussed which activities to do which wants to avoid.  Discussed icing regimen and home exercise, increase activity as tolerated.  Follow-up again in 4 to 8 weeks.

## 2019-02-26 NOTE — Patient Instructions (Signed)
Pennsaid 2x a day as needed Ice 47min 2x a day Exercises 3x a week See me in 5-6 weeks

## 2019-02-26 NOTE — Assessment & Plan Note (Signed)
Decision today to treat with OMT was based on Physical Exam  After verbal consent patient was treated with HVLA, ME, FPR techniques in  thoracic, lumbar and sacral areas  Patient tolerated the procedure well with improvement in symptoms  Patient given exercises, stretches and lifestyle modifications  See medications in patient instructions if given  Patient will follow up in 4-8 weeks 
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
## 2019-03-01 ENCOUNTER — Ambulatory Visit: Payer: Medicare Other

## 2019-03-09 ENCOUNTER — Ambulatory Visit: Payer: Medicare Other | Attending: Internal Medicine

## 2019-03-09 DIAGNOSIS — Z23 Encounter for immunization: Secondary | ICD-10-CM | POA: Insufficient documentation

## 2019-03-09 NOTE — Progress Notes (Signed)
   [id number]  Name:  Jacob [name].    MRN: [id number] DOB: [birth year]  03/09/2019  Jacob [name] was observed post Covid-19 immunization for 15 minutes without incidence. He was provided with Vaccine Information Sheet and instruction to access the V-Safe system.   Jacob [name] was instructed to call 911 with any severe reactions post vaccine: Marland Kitchen Difficulty breathing  . Swelling of your face and throat  . A fast heartbeat  . A bad rash all over your body  . Dizziness and weakness    Immunizations Administered    Name Date Dose VIS Date Route   Pfizer COVID-19 Vaccine 03/09/2019  2:27 PM 0.3 mL 01/12/2019 Intramuscular   Manufacturer: [place]   Lot: [id number]   [place]: [id number]

## 2019-03-12 ENCOUNTER — Ambulatory Visit: Payer: Medicare Other

## 2019-04-03 ENCOUNTER — Ambulatory Visit: Payer: Medicare Other | Attending: Internal Medicine

## 2019-04-03 DIAGNOSIS — Z23 Encounter for immunization: Secondary | ICD-10-CM | POA: Insufficient documentation

## 2019-04-03 NOTE — Progress Notes (Signed)
   [id number]  Name:  Akhilesh Sparr.    MRN: [id number] DOB: 1950/11/11  04/03/2019  Mr. Cheeks was observed post Covid-19 immunization for 15 minutes without incident. He was provided with Vaccine Information Sheet and instruction to access the V-Safe system.   Mr. Beardall was instructed to call 911 with any severe reactions post vaccine: Marland Kitchen Difficulty breathing  . Swelling of face and throat  . A fast heartbeat  . A bad rash all over body  . Dizziness and weakness   Immunizations Administered    Name Date Dose VIS Date Route   Pfizer COVID-19 Vaccine 04/03/2019  2:17 PM 0.3 mL 01/12/2019 Intramuscular   Manufacturer: [place]   Lot: [id number]   [place]: [id number]

## 2019-04-12 ENCOUNTER — Encounter: Payer: Self-pay | Admitting: Family Medicine

## 2019-04-17 ENCOUNTER — Encounter: Payer: Self-pay | Admitting: Family Medicine

## 2019-04-17 ENCOUNTER — Other Ambulatory Visit: Payer: Self-pay

## 2019-04-17 ENCOUNTER — Ambulatory Visit (INDEPENDENT_AMBULATORY_CARE_PROVIDER_SITE_OTHER): Payer: Medicare Other | Admitting: Family Medicine

## 2019-04-17 VITALS — BP 148/82 | HR 75 | Ht 70.0 in | Wt 190.0 lb

## 2019-04-17 DIAGNOSIS — M999 Biomechanical lesion, unspecified: Secondary | ICD-10-CM | POA: Diagnosis not present

## 2019-04-17 DIAGNOSIS — M533 Sacrococcygeal disorders, not elsewhere classified: Secondary | ICD-10-CM

## 2019-04-17 NOTE — Patient Instructions (Addendum)
Good to see you Piriformis exercises Switch the leg raises from the floor to the wall Biking only for cardio Ok to golf Tennis ball in back right pocket See me again in 3 - 4 weeks

## 2019-04-17 NOTE — Progress Notes (Signed)
[place] [address] [place] [facility] Phone: [phone] Subjective:   I Jacob [name] am serving as a Education administrator for Dr. Hulan Saas.  This visit occurred during the SARS-CoV-2 public health emergency.  Safety protocols were in place, including screening questions prior to the visit, additional usage of staff PPE, and extensive cleaning of exam room while observing appropriate contact time as indicated for disinfecting solutions.   I'm seeing this patient by the request  of:  Koberlein, Steele Berg, MD  CC: Low back pain follow-up  [id number]   Jacob [name]. is a 69 y.o. male coming in with complaint of SI joint dysfunction. Patient was last seen 02/25/2018 for OMT. Patient states his pain has moved to his right hip. Side lying him abduction is 6/10 on the pain scale and he struggles with the exercise on the right side. States he noticed the difference last Thursday. Woke up with hip issues with weight bearing. Played golf the day before. Monday morning he completed his exercises and put his TENS unit on his hip for 15 mins. Iced and used Ibuprofen. Also rolls out his glut muscles after activity.        Past Medical History:  Diagnosis Date  . BPH associated with nocturia   . Hyperlipidemia    Past Surgical History:  Procedure Laterality Date  . APPENDECTOMY  1973  . KNEE ARTHROSCOPY Left    x2  . ROTATOR CUFF REPAIR Bilateral    Social History   Socioeconomic History  . Marital status: Married    Spouse name: Not on file  . Number of children: Not on file  . Years of education: Not on file  . Highest education level: Not on file  Occupational History  . Not on file  Tobacco Use  . Smoking status: Never Smoker  . Smokeless tobacco: Never Used  Substance and Sexual Activity  . Alcohol use: Yes    Alcohol/week: 7.0 standard drinks    Types: 7 Standard drinks or equivalent per week    Comment: 2 glasses wine nightly; 4 shot  liquor  . Drug use: No  . Sexual activity: Not on file  Other Topics Concern  . Not on file  Social History Narrative  . Not on file   Social Determinants of Health   Financial Resource Strain:   . Difficulty of Paying Living Expenses:   Food Insecurity:   . Worried About Charity fundraiser in the Last Year:   . Arboriculturist in the Last Year:   Transportation Needs:   . Film/video editor (Medical):   Marland Kitchen Lack of Transportation (Non-Medical):   Physical Activity:   . Days of Exercise per Week:   . Minutes of Exercise per Session:   Stress:   . Feeling of Stress :   Social Connections:   . Frequency of Communication with Friends and Family:   . Frequency of Social Gatherings with Friends and Family:   . Attends Religious Services:   . Active Member of Clubs or Organizations:   . Attends Archivist Meetings:   Marland Kitchen Marital Status:    Allergies  Allergen Reactions  . Other Other (See Comments)    Pet dander, tree mold, ragweed, pollen cause sneezing and drainage   Family History  Problem Relation Age of Onset  . Sarcoidosis Mother        lung  . Heart failure Father 46  . Rheum arthritis  Father   . Heart attack Maternal Grandfather 60  . Other Paternal Grandfather 42  . Gout Brother   . Diabetes Neg Hx          Current Outpatient Medications (Other):  Marland Kitchen  Multiple Vitamins-Minerals (MULTIVITAMIN ADULTS PO), Take by mouth. .  Probiotic Product (PROBIOTIC PO), Take by mouth daily.   Reviewed prior external information including notes and imaging from  primary care provider As well as notes that were available from care everywhere and other healthcare systems.  Past medical history, social, surgical and family history all reviewed in electronic medical record.  No pertanent information unless stated regarding to the chief complaint.   Review of Systems:  No headache, visual changes, nausea, vomiting, diarrhea, constipation, dizziness, abdominal  pain, skin rash, fevers, chills, night sweats, weight loss, swollen lymph nodes, body aches, joint swelling, chest pain, shortness of breath, mood changes. POSITIVE muscle aches  Objective  Blood pressure (!) 148/82, pulse 75, height 5\' 10"  (1.778 m), weight 190 lb (86.2 kg), SpO2 98 %.   General: No apparent distress alert and oriented x3 mood and affect normal, dressed appropriately.  HEENT: Pupils equal, extraocular movements intact  Respiratory: Patient's speak in full sentences and does not appear short of breath  Cardiovascular: No lower extremity edema, non tender, no erythema  Skin: Warm dry intact with no signs of infection or rash on extremities or on axial skeleton.  Abdomen: Soft nontender  Neuro: Cranial nerves II through XII are intact, neurovascularly intact in all extremities with 2+ DTRs and 2+ pulses.  Lymph: No lymphadenopathy of posterior or anterior cervical chain or axillae bilaterally.  Gait normal with good balance and coordination.  MSK:  Non tender with full range of motion and good stability and symmetric strength and tone of shoulders, elbows, wrist, hip, knee and ankles bilaterally.  Low back exam shows the patient does have some very mild loss of lordosis.  Continued tenderness over the right sacroiliac joint but also over the piriformis on the right side.  Positive FABER test.  Negative straight leg test.  Osteopathic findings T4 extended rotated and side bent left L2 flexed rotated and side bent right Sacrum right on right    Impression and Recommendations:     This case required medical decision making of moderate complexity. The above documentation has been reviewed and is accurate and complete [name], DO       Note: This dictation was prepared with Dragon dictation along with smaller phrase technology. Any transcriptional errors that result from this process are unintentional.

## 2019-04-17 NOTE — Assessment & Plan Note (Signed)
Chronic problem with mild exacerbation.  I do believe that the sacroiliac dysfunction is giving patient more of a piriformis syndrome.  We discussed different manual massage techniques that I think will be beneficial.  Discussed medication management and patient will continue with home exercises as well as the certain over-the-counter medications at the moment.  Responding fairly well to manipulation. F/u 4 weeks

## 2019-04-17 NOTE — Assessment & Plan Note (Signed)
Decision today to treat with OMT was based on Physical Exam  After verbal consent patient was treated with HVLA, ME, FPR techniques in  thoracic, lumbar and sacral areas  Patient tolerated the procedure well with improvement in symptoms  Patient given exercises, stretches and lifestyle modifications  See medications in patient instructions if given  Patient will follow up in 4-8 weeks 

## 2019-05-08 ENCOUNTER — Ambulatory Visit (INDEPENDENT_AMBULATORY_CARE_PROVIDER_SITE_OTHER): Payer: Medicare Other | Admitting: Family Medicine

## 2019-05-08 ENCOUNTER — Encounter: Payer: Self-pay | Admitting: Family Medicine

## 2019-05-08 ENCOUNTER — Other Ambulatory Visit: Payer: Self-pay

## 2019-05-08 VITALS — BP 124/84 | HR 70 | Ht 70.0 in | Wt 189.0 lb

## 2019-05-08 DIAGNOSIS — M533 Sacrococcygeal disorders, not elsewhere classified: Secondary | ICD-10-CM

## 2019-05-08 DIAGNOSIS — M999 Biomechanical lesion, unspecified: Secondary | ICD-10-CM

## 2019-05-08 MED ORDER — MELOXICAM 15 MG PO TABS: 15.0000 mg | ORAL_TABLET | Freq: Every day | ORAL | 0 refills | Status: DC

## 2019-05-08 NOTE — Patient Instructions (Signed)
Meloxicam 3 day bursts when needed Ok to start golf 1-2 x a week next week, more in May See me in 7-8 weeks

## 2019-05-08 NOTE — Progress Notes (Signed)
[place] Phone: [phone] Subjective:   Jacob [name], am serving as a scribe for Dr. Hulan Saas. This visit occurred during the SARS-CoV-2 public health emergency.  Safety protocols were in place, including screening questions prior to the visit, additional usage of staff PPE, and extensive cleaning of exam room while observing appropriate contact time as indicated for disinfecting solutions.    I'm seeing this patient by the request  of:  Koberlein, Steele Berg, MD  CC: Low back pain follow-up  [id number]  Jacob Maneval. is a 69 y.o. male coming in with complaint of right hip and back pain. Last seen on 04/17/2019. Patient states that he is doing better. Right hip pain has been less than last visit. Has been cycling, elliptical, and walking.  Patient has been doing much better.  Making progress.  Has not been playing golf yet.  Patient has decreased the amount of ibuprofen significantly at the moment though.    Past Medical History:  Diagnosis Date  . BPH associated with nocturia   . Hyperlipidemia    Past Surgical History:  Procedure Laterality Date  . APPENDECTOMY  1973  . KNEE ARTHROSCOPY Left    x2  . ROTATOR CUFF REPAIR Bilateral    Social History   Socioeconomic History  . Marital status: Married    Spouse name: Not on file  . Number of children: Not on file  . Years of education: Not on file  . Highest education level: Not on file  Occupational History  . Not on file  Tobacco Use  . Smoking status: Never Smoker  . Smokeless tobacco: Never Used  Substance and Sexual Activity  . Alcohol use: Yes    Alcohol/week: 7.0 standard drinks    Types: 7 Standard drinks or equivalent per week    Comment: 2 glasses wine nightly; 4 shot liquor  . Drug use: [name]  . Sexual activity: Not on file  Other Topics Concern  . Not on file  Social History Narrative  . Not on file   Social Determinants  of Health   Financial Resource Strain:   . Difficulty of Paying Living Expenses:   Food Insecurity:   . Worried About Charity fundraiser in the Last Year:   . Arboriculturist in the Last Year:   Transportation Needs:   . Film/video editor (Medical):   Marland Kitchen Lack of Transportation (Non-Medical):   Physical Activity:   . Days of Exercise per Week:   . Minutes of Exercise per Session:   Stress:   . Feeling of Stress :   Social Connections:   . Frequency of Communication with Friends and Family:   . Frequency of Social Gatherings with Friends and Family:   . Attends Religious Services:   . Active Member of Clubs or Organizations:   . Attends Archivist Meetings:   Marland Kitchen Marital Status:    Allergies  Allergen Reactions  . Other Other (See Comments)    Pet dander, tree mold, ragweed, pollen cause sneezing and drainage   Family History  Problem Relation Age of Onset  . Sarcoidosis Mother        lung  . Heart failure Father 19  . Rheum arthritis Father   . Heart attack Maternal Grandfather 60  . Other Paternal Grandfather 36  . Gout Brother   . Diabetes Neg Hx        Current  Outpatient Medications (Analgesics):  .  meloxicam (MOBIC) 15 MG tablet, Take 1 tablet (15 mg total) by mouth daily.   Current Outpatient Medications (Other):  Marland Kitchen  Multiple Vitamins-Minerals (MULTIVITAMIN ADULTS PO), Take by mouth. .  Probiotic Product (PROBIOTIC PO), Take by mouth daily.   Reviewed prior external information including notes and imaging from  primary care provider As well as notes that were available from care everywhere and other healthcare systems.  Past medical history, social, surgical and family history all reviewed in electronic medical record.  [name] pertanent information unless stated regarding to the chief complaint.   Review of Systems:  [name] headache, visual changes, nausea, vomiting, diarrhea, constipation, dizziness, abdominal pain, skin rash, fevers, chills,  night sweats, weight loss, swollen lymph nodes, body aches, joint swelling, chest pain, shortness of breath, mood changes. POSITIVE muscle aches  Objective  Blood pressure 124/84, pulse 70, height 5\' 10"  (1.778 m), weight 189 lb (85.7 kg), SpO2 98 %.   General: [name] apparent distress alert and oriented x3 mood and affect normal, dressed appropriately.  HEENT: Pupils equal, extraocular movements intact  Respiratory: Patient's speak in full sentences and does not appear short of breath  Cardiovascular: [name] lower extremity edema, non tender, [name] erythema  Neuro: Cranial nerves II through XII are intact, neurovascularly intact in all extremities with 2+ DTRs and 2+ pulses.  Gait normal with good balance and coordination.  MSK:  Non tender with full range of motion and good stability and symmetric strength and tone of shoulders, elbows, wrist, hip, knee and ankles bilaterally.  Low back exam does have some mild loss of lordosis.  Tender to palpation in the paraspinal musculature left greater than right.  Mild tightness of the hip flexors.  Negative straight leg test  Osteopathic findings  T9 extended rotated and side bent left L2 flexed rotated and side bent right Sacrum right on right    Impression and Recommendations:     This case required medical decision making of moderate complexity. The above documentation has been reviewed and is accurate and complete Jacob Pulley, DO       Note: This dictation was prepared with Dragon dictation along with smaller phrase technology. Any transcriptional errors that result from this process are unintentional.

## 2019-05-08 NOTE — Assessment & Plan Note (Signed)

## 2019-05-08 NOTE — Assessment & Plan Note (Signed)
Patient mild exacerbation.  Doing relatively well overall.  Discussed icing regimen and home exercise, increase activity slowly.  Patient is doing better.  We will see patient back again in 6 to 8 weeks.  Meloxicam prescribed today.

## 2019-05-31 ENCOUNTER — Other Ambulatory Visit: Payer: Self-pay | Admitting: Family Medicine

## 2019-06-14 DIAGNOSIS — L570 Actinic keratosis: Secondary | ICD-10-CM | POA: Diagnosis not present

## 2019-06-14 DIAGNOSIS — L821 Other seborrheic keratosis: Secondary | ICD-10-CM | POA: Diagnosis not present

## 2019-06-14 DIAGNOSIS — D1801 Hemangioma of skin and subcutaneous tissue: Secondary | ICD-10-CM | POA: Diagnosis not present

## 2019-06-14 DIAGNOSIS — L814 Other melanin hyperpigmentation: Secondary | ICD-10-CM | POA: Diagnosis not present

## 2019-06-14 DIAGNOSIS — D2272 Melanocytic nevi of left lower limb, including hip: Secondary | ICD-10-CM | POA: Diagnosis not present

## 2019-06-26 ENCOUNTER — Ambulatory Visit (INDEPENDENT_AMBULATORY_CARE_PROVIDER_SITE_OTHER): Payer: Medicare Other | Admitting: Family Medicine

## 2019-06-26 ENCOUNTER — Encounter: Payer: Self-pay | Admitting: Family Medicine

## 2019-06-26 ENCOUNTER — Other Ambulatory Visit: Payer: Self-pay

## 2019-06-26 VITALS — BP 130/90 | HR 62 | Ht 70.0 in | Wt 188.0 lb

## 2019-06-26 DIAGNOSIS — M999 Biomechanical lesion, unspecified: Secondary | ICD-10-CM

## 2019-06-26 DIAGNOSIS — M533 Sacrococcygeal disorders, not elsewhere classified: Secondary | ICD-10-CM

## 2019-06-26 NOTE — Assessment & Plan Note (Signed)
Chronic problem with stable.  Discussed icing regimen and home exercise, discussed which activities to do which wants to avoid.  Discussed hip abductor strengthening.  Follow-up again 4 to 8 weeks

## 2019-06-26 NOTE — Patient Instructions (Addendum)
Good to see you Do wall exercise Keep doing everything else Body helix thigh compression sleeve See me again in 2 months

## 2019-06-26 NOTE — Progress Notes (Signed)
[place] [address] [place] Phone: [phone] Subjective:   I Jacob [name] am serving as a Education administrator for Dr. Hulan Saas.  This visit occurred during the SARS-CoV-2 public health emergency.  Safety protocols were in place, including screening questions prior to the visit, additional usage of staff PPE, and extensive cleaning of exam room while observing appropriate contact time as indicated for disinfecting solutions.   I'm seeing this patient by the request  of:  [name], MD  CC: Back pain follow-up  [id number]   05/08/2019 Patient mild exacerbation.  Doing relatively well overall.  Discussed icing regimen and home exercise, increase activity slowly.  Patient is doing better.  We will see patient back again in 6 to 8 weeks.  Meloxicam prescribed today.  Update 06/26/2019 Jacob Bowens. is a 69 y.o. male coming in with complaint of low back pain.Last seen 05/08/2019 for OMT. Patient states he has been improving. Some weakness in the lateral right hip. Still tight and sore in the piriformis/glut area. States he is stronger and has been exercising. Walking about 2 miles without pain. Can ride a bike for 15 miles without any discomfort. Has been taking meloxicam but hasn't taken any in the past 2 weeks.  Noticing some tightness in the hamstring from time to time.  Nothing no severe as what it had been.  Looking to start increasing resistance training and wants to know if it is safe to do so.     Past Medical History:  Diagnosis Date  . BPH associated with nocturia   . Hyperlipidemia    Past Surgical History:  Procedure Laterality Date  . APPENDECTOMY  1973  . KNEE ARTHROSCOPY Left    x2  . ROTATOR CUFF REPAIR Bilateral    Social History   Socioeconomic History  . Marital status: Married    Spouse name: Not on file  . Number of children: Not on file  . Years of education: Not on file  . Highest education level:  Not on file  Occupational History  . Not on file  Tobacco Use  . Smoking status: Never Smoker  . Smokeless tobacco: Never Used  Substance and Sexual Activity  . Alcohol use: Yes    Alcohol/week: 7.0 standard drinks    Types: 7 Standard drinks or equivalent per week    Comment: 2 glasses wine nightly; 4 shot liquor  . Drug use: No  . Sexual activity: Not on file  Other Topics Concern  . Not on file  Social History Narrative  . Not on file   Social Determinants of Health   Financial Resource Strain:   . Difficulty of Paying Living Expenses:   Food Insecurity:   . Worried About Charity fundraiser in the Last Year:   . Arboriculturist in the Last Year:   Transportation Needs:   . Film/video editor (Medical):   Marland Kitchen Lack of Transportation (Non-Medical):   Physical Activity:   . Days of Exercise per Week:   . Minutes of Exercise per Session:   Stress:   . Feeling of Stress :   Social Connections:   . Frequency of Communication with Friends and Family:   . Frequency of Social Gatherings with Friends and Family:   . Attends Religious Services:   . Active Member of Clubs or Organizations:   . Attends Archivist Meetings:   Marland Kitchen Marital Status:    Allergies  Allergen Reactions  . Other Other (See Comments)    Pet dander, tree mold, ragweed, pollen cause sneezing and drainage   Family History  Problem Relation Age of Onset  . Sarcoidosis Mother        lung  . Heart failure Father 31  . Rheum arthritis Father   . Heart attack Maternal Grandfather 60  . Other Paternal Grandfather 60  . Gout Brother   . Diabetes Neg Hx        Current Outpatient Medications (Analgesics):  .  meloxicam (MOBIC) 15 MG tablet, TAKE 1 TABLET BY MOUTH EVERY DAY   Current Outpatient Medications (Other):  Marland Kitchen  Multiple Vitamins-Minerals (MULTIVITAMIN ADULTS PO), Take by mouth. .  Probiotic Product (PROBIOTIC PO), Take by mouth daily.   Reviewed prior external information  including notes and imaging from  primary care provider As well as notes that were available from care everywhere and other healthcare systems.  Past medical history, social, surgical and family history all reviewed in electronic medical record.  No pertanent information unless stated regarding to the chief complaint.   Review of Systems:  No headache, visual changes, nausea, vomiting, diarrhea, constipation, dizziness, abdominal pain, skin rash, fevers, chills, night sweats, weight loss, swollen lymph nodes, body aches, joint swelling, chest pain, shortness of breath, mood changes. POSITIVE muscle aches  Objective  Blood pressure 130/90, pulse 62, height 5\' 10"  (1.778 m), weight 188 lb (85.3 kg), SpO2 97 %.   General: No apparent distress alert and oriented x3 mood and affect normal, dressed appropriately.  HEENT: Pupils equal, extraocular movements intact  Respiratory: Patient's speak in full sentences and does not appear short of breath  Cardiovascular: No lower extremity edema, non tender, no erythema  Neuro: Cranial nerves II through XII are intact, neurovascularly intact in all extremities with 2+ DTRs and 2+ pulses.  Gait  antalgic MSK:   continues to have some discomfort overall.  Patient has some mild tightness in the paraspinal musculature lumbar spine right greater than left.  Mild tightness on the right hamstring.  Negative no referred pain with straight leg test.  Neurovascularly intact distally.  Osteopathic findings  T9 extended rotated and side bent left L2 flexed rotated and side bent right Sacrum right on right      Impression and Recommendations:     The above documentation has been reviewed and is accurate and complete Jacob Pulley, DO       Note: This dictation was prepared with Dragon dictation along with smaller phrase technology. Any transcriptional errors that result from this process are unintentional.

## 2019-06-26 NOTE — Assessment & Plan Note (Signed)

## 2019-08-23 DIAGNOSIS — H2513 Age-related nuclear cataract, bilateral: Secondary | ICD-10-CM | POA: Diagnosis not present

## 2019-08-23 DIAGNOSIS — H40033 Anatomical narrow angle, bilateral: Secondary | ICD-10-CM | POA: Diagnosis not present

## 2019-08-23 DIAGNOSIS — H04123 Dry eye syndrome of bilateral lacrimal glands: Secondary | ICD-10-CM | POA: Diagnosis not present

## 2019-08-23 DIAGNOSIS — H40013 Open angle with borderline findings, low risk, bilateral: Secondary | ICD-10-CM | POA: Diagnosis not present

## 2019-08-27 ENCOUNTER — Ambulatory Visit (INDEPENDENT_AMBULATORY_CARE_PROVIDER_SITE_OTHER): Payer: Medicare Other | Admitting: Family Medicine

## 2019-08-27 ENCOUNTER — Encounter: Payer: Self-pay | Admitting: Family Medicine

## 2019-08-27 ENCOUNTER — Other Ambulatory Visit: Payer: Self-pay

## 2019-08-27 VITALS — BP 132/80 | HR 48 | Ht 70.0 in | Wt 185.0 lb

## 2019-08-27 DIAGNOSIS — M999 Biomechanical lesion, unspecified: Secondary | ICD-10-CM | POA: Diagnosis not present

## 2019-08-27 DIAGNOSIS — M533 Sacrococcygeal disorders, not elsewhere classified: Secondary | ICD-10-CM

## 2019-08-27 NOTE — Progress Notes (Signed)
[place] Phone: [phone] Subjective:   Jacob [name], am serving as a scribe for Dr. Hulan Saas. This visit occurred during the SARS-CoV-2 public health emergency.  Safety protocols were in place, including screening questions prior to the visit, additional usage of staff PPE, and extensive cleaning of exam room while observing appropriate contact time as indicated for disinfecting solutions.   I'm seeing this patient by the request  of:  Koberlein, Steele Berg, MD  CC: Low back pain follow-up  [id number]  Jacob [name]. is a 69 y.o. male coming in with complaint of back and neck pain. OMT 06/26/2019. Patient states that his hip is improving but is not 100%. Notes pain and weakness in right glute and hamstring. Has been doing HEP. [name] discomfort with golfing or biking.   Medications patient has been prescribed: Taking meloxicam  Taking: Intermittently         Reviewed prior external information including notes and imaging from previsou exam, outside providers and external EMR if available.   As well as notes that were available from care everywhere and other healthcare systems.  Past medical history, social, surgical and family history all reviewed in electronic medical record.  [name] pertanent information unless stated regarding to the chief complaint.   Past Medical History:  Diagnosis Date  . BPH associated with nocturia   . Hyperlipidemia     Allergies  Allergen Reactions  . Other Other (See Comments)    Pet dander, tree mold, ragweed, pollen cause sneezing and drainage     Review of Systems:  [name] headache, visual changes, nausea, vomiting, diarrhea, constipation, dizziness, abdominal pain, skin rash, fevers, chills, night sweats, weight loss, swollen lymph nodes, body aches, joint swelling, chest pain, shortness of breath, mood changes. POSITIVE muscle aches  Objective  Blood pressure (!)  132/80, pulse 48, height 5\' 10"  (1.778 m), weight 185 lb (83.9 kg), SpO2 99 %.   General: [name] apparent distress alert and oriented x3 mood and affect normal, dressed appropriately.  HEENT: Pupils equal, extraocular movements intact  Respiratory: Patient's speak in full sentences and does not appear short of breath  Cardiovascular: [name] lower extremity edema, non tender, [name] erythema  Neuro: Cranial nerves II through XII are intact, neurovascularly intact in all extremities with 2+ DTRs and 2+ pulses.  Gait normal with good balance and coordination.  MSK:  Non tender with full range of motion and good stability and symmetric strength and tone of shoulders, elbows, wrist, hip, knee and ankles bilaterally.  Back -shows some improvement in core strength already.  Patient does have some improvement in hip abductor strength and tightness with FABER test still noted.  Negative straight leg test.  Near full range of motion  Osteopathic findings  T9 extended rotated and side bent left L2 flexed rotated and side bent right Sacrum right on right       Assessment and Plan:  SI (sacroiliac) joint dysfunction Stable chronic.  Encouraged meloxicam more inverse.  Patient is doing well and follow-up again in 3 to 4 months    Nonallopathic problems  Decision today to treat with OMT was based on Physical Exam  After verbal consent patient was treated with HVLA, ME, FPR techniques in cervical, rib, thoracic, lumbar, and sacral  areas  Patient tolerated the procedure well with improvement in symptoms  Patient given exercises, stretches and lifestyle modifications  See medications in patient instructions if given  Patient  will follow up in 4-8 weeks      The above documentation has been reviewed and is accurate and complete [name], DO       Note: This dictation was prepared with Dragon dictation along with smaller phrase technology. Any transcriptional errors that result from this  process are unintentional.

## 2019-08-27 NOTE — Assessment & Plan Note (Signed)
Stable chronic.  Encouraged meloxicam more inverse.  Patient is doing well and follow-up again in 3 to 4 months

## 2019-08-27 NOTE — Patient Instructions (Signed)
See me again in 2 months °

## 2019-10-31 ENCOUNTER — Encounter: Payer: Self-pay | Admitting: Family Medicine

## 2019-11-06 ENCOUNTER — Other Ambulatory Visit: Payer: Self-pay

## 2019-11-06 ENCOUNTER — Encounter: Payer: Self-pay | Admitting: Family Medicine

## 2019-11-06 ENCOUNTER — Ambulatory Visit (INDEPENDENT_AMBULATORY_CARE_PROVIDER_SITE_OTHER): Payer: Medicare Other | Admitting: Family Medicine

## 2019-11-06 DIAGNOSIS — M999 Biomechanical lesion, unspecified: Secondary | ICD-10-CM

## 2019-11-06 DIAGNOSIS — M533 Sacrococcygeal disorders, not elsewhere classified: Secondary | ICD-10-CM | POA: Diagnosis not present

## 2019-11-06 NOTE — Progress Notes (Signed)
[facility] [address] [place] Phone: [phone] Subjective:   I Jacob [name] am serving as a Education administrator for Dr. Hulan Saas.  This visit occurred during the SARS-CoV-2 public health emergency.  Safety protocols were in place, including screening questions prior to the visit, additional usage of staff PPE, and extensive cleaning of exam room while observing appropriate contact time as indicated for disinfecting solutions.   I'm seeing this patient by the request  of:  Koberlein, Steele Berg, MD  CC: Low back and right hip pain follow-up  [id number]  Jacob [name]. is a 69 y.o. male coming in with complaint of right hip pain. OMT 08/27/2019. Patient states his right hip is tight. Stretches every day and still having issues.  Patient states that he is continuing to be aware of the area but nothing that stops him from activity.  Does sometimes at night given some irritation.  Medications patient has been prescribed: Meloxicam  Taking: Intermittently         Reviewed prior external information including notes and imaging from previsou exam, outside providers and external EMR if available.   As well as notes that were available from care everywhere and other healthcare systems.  Past medical history, social, surgical and family history all reviewed in electronic medical record.  No pertanent information unless stated regarding to the chief complaint.   Past Medical History:  Diagnosis Date  . BPH associated with nocturia   . Hyperlipidemia     Allergies  Allergen Reactions  . Other Other (See Comments)    Pet dander, tree mold, ragweed, pollen cause sneezing and drainage     Review of Systems:  No headache, visual changes, nausea, vomiting, diarrhea, constipation, dizziness, abdominal pain, skin rash, fevers, chills, night sweats, weight loss, swollen lymph nodes, body aches, joint swelling, chest pain, shortness of breath,  mood changes. POSITIVE muscle aches  Objective  Blood pressure 130/90, pulse 62, height 5\' 10"  (1.778 m), weight 183 lb (83 kg), SpO2 97 %.   General: No apparent distress alert and oriented x3 mood and affect normal, dressed appropriately.  HEENT: Pupils equal, extraocular movements intact  Respiratory: Patient's speak in full sentences and does not appear short of breath  Cardiovascular: No lower extremity edema, non tender, no erythema  Neuro: Cranial nerves II through XII are intact, neurovascularly intact in all extremities with 2+ DTRs and 2+ pulses.  Gait normal with good balance and coordination.  MSK:  Non tender with full range of motion and good stability and symmetric strength and tone of shoulders, elbows, wrist, hip, knee and ankles bilaterally.  Back -tightness in the paraspinal musculature right greater than left.  Mild positive Corky Sox on the right side.  Negative straight leg test but does have tightness of the hamstring.  5-5 strength of the lower extremities.  Osteopathic findings  T8 extended rotated and side bent left L1 flexed rotated and side bent right Sacrum right on right       Assessment and Plan:  SI (sacroiliac) joint dysfunction Chronic problem but seems to be relatively stable.  Has been taking the meloxicam regularly more of the over-the-counter medicines.  Trying to stay active and play golf on a regular basis.  Does respond fairly well to osteopathic manipulation.  No significant changes.  Will follow up with me again in 2 months    Nonallopathic problems  Decision today to treat with OMT was based on Physical Exam  After  verbal consent patient was treated with HVLA, ME, FPR techniques in  thoracic, lumbar, and sacral  areas  Patient tolerated the procedure well with improvement in symptoms  Patient given exercises, stretches and lifestyle modifications  See medications in patient instructions if given  Patient will follow up in 4-8 weeks       The above documentation has been reviewed and is accurate and complete [name], DO       Note: This dictation was prepared with Dragon dictation along with smaller phrase technology. Any transcriptional errors that result from this process are unintentional.

## 2019-11-06 NOTE — Patient Instructions (Signed)
Good to see you Try the heel lifts Exercise 3 times a week See me again in 6-8 weeks

## 2019-11-06 NOTE — Assessment & Plan Note (Signed)
Chronic problem but seems to be relatively stable.  Has been taking the meloxicam regularly more of the over-the-counter medicines.  Trying to stay active and play golf on a regular basis.  Does respond fairly well to osteopathic manipulation.  No significant changes.  Will follow up with me again in 2 months

## 2019-11-07 DIAGNOSIS — Z23 Encounter for immunization: Secondary | ICD-10-CM | POA: Diagnosis not present

## 2019-12-10 ENCOUNTER — Encounter: Payer: Medicare Other | Admitting: Family Medicine

## 2019-12-17 NOTE — Progress Notes (Signed)
[facility] [place] Phone: [phone] Subjective:   Fontaine No, am serving as a scribe for Dr. Hulan Saas. This visit occurred during the SARS-CoV-2 public health emergency.  Safety protocols were in place, including screening questions prior to the visit, additional usage of staff PPE, and extensive cleaning of exam room while observing appropriate contact time as indicated for disinfecting solutions.  I'm seeing this patient by the request  of:  [name], MD  CC: Neck and back pain follow-up  [id number]  Jacob [name]. is a 69 y.o. male coming in with complaint of back and neck pain. OMT 11/06/2019. Patient states that the sciatic nerve pain is worse on right side as he has been driving a lot. Would like to get back in the gym and wants to know what he can and cannot do. He has been stretching for pain relief.  Patient is looking forward to being able to get back on a regular routine with working out.  Medications patient has been prescribed: None          Reviewed prior external information including notes and imaging from previsou exam, outside providers and external EMR if available.   As well as notes that were available from care everywhere and other healthcare systems.  Past medical history, social, surgical and family history all reviewed in electronic medical record.  No pertanent information unless stated regarding to the chief complaint.   Past Medical History:  Diagnosis Date  . BPH associated with nocturia   . Hyperlipidemia     Allergies  Allergen Reactions  . Other Other (See Comments)    Pet dander, tree mold, ragweed, pollen cause sneezing and drainage     Review of Systems:  No headache, visual changes, nausea, vomiting, diarrhea, constipation, dizziness, abdominal pain, skin rash, fevers, chills, night sweats, weight loss, swollen lymph nodes, body aches, joint  swelling, chest pain, shortness of breath, mood changes. POSITIVE muscle aches  Objective  Blood pressure 110/82, pulse 68, height 5\' 10"  (1.778 m), weight 187 lb (84.8 kg), SpO2 98 %.   General: No apparent distress alert and oriented x3 mood and affect normal, dressed appropriately.  HEENT: Pupils equal, extraocular movements intact  Respiratory: Patient's speak in full sentences and does not appear short of breath  Cardiovascular: No lower extremity edema, non tender, no erythema  Neuro: Cranial nerves II through XII are intact, neurovascularly intact in all extremities with 2+ DTRs and 2+ pulses.  Gait normal with good balance and coordination.  MSK:  Non tender with full range of motion and good stability and symmetric strength and tone of shoulders, elbows, wrist, hip, knee and ankles bilaterally.  Back -low back exam shows the patient does have some tenderness to palpation in the paraspinal musculature lumbar spine.  Mild increase in tightness of the right sacroiliac joint.  Patient likely does have fairly good range of motion lacking the last 5 degrees of extension.  Osteopathic findings   T8 extended rotated and side bent left L2 flexed rotated and side bent right Sacrum right on right       Assessment and Plan:  SI (sacroiliac) joint dysfunction Chronic problem with mild exacerbation.  The patient still does not want any pain medications.  The patient does not want the gabapentin and is trying everything natural at the moment.  Responding fairly well to osteopathic manipulation.  Discussed posture and ergonomics.  Increase activity slowly.  Follow-up again in 8 weeks patient given some routine to do at the gym we discussed what activities to avoid    Nonallopathic problems  Decision today to treat with OMT was based on Physical Exam  After verbal consent patient was treated with HVLA, ME, FPR techniques in  thoracic, lumbar, and sacral  areas  Patient tolerated the  procedure well with improvement in symptoms  Patient given exercises, stretches and lifestyle modifications  See medications in patient instructions if given  Patient will follow up in 4-8 weeks      The above documentation has been reviewed and is accurate and complete [name], DO       Note: This dictation was prepared with Dragon dictation along with smaller phrase technology. Any transcriptional errors that result from this process are unintentional.

## 2019-12-18 ENCOUNTER — Encounter: Payer: Self-pay | Admitting: Family Medicine

## 2019-12-18 ENCOUNTER — Other Ambulatory Visit: Payer: Self-pay

## 2019-12-18 ENCOUNTER — Ambulatory Visit (INDEPENDENT_AMBULATORY_CARE_PROVIDER_SITE_OTHER): Payer: Medicare Other | Admitting: Family Medicine

## 2019-12-18 VITALS — BP 110/82 | HR 68 | Ht 70.0 in | Wt 187.0 lb

## 2019-12-18 DIAGNOSIS — M533 Sacrococcygeal disorders, not elsewhere classified: Secondary | ICD-10-CM

## 2019-12-18 DIAGNOSIS — M999 Biomechanical lesion, unspecified: Secondary | ICD-10-CM | POA: Diagnosis not present

## 2019-12-18 NOTE — Assessment & Plan Note (Signed)
Chronic problem with mild exacerbation.  The patient still does not want any pain medications.  The patient does not want the gabapentin and is trying everything natural at the moment.  Responding fairly well to osteopathic manipulation.  Discussed posture and ergonomics.  Increase activity slowly.  Follow-up again in 8 weeks patient given some routine to do at the gym we discussed what activities to avoid

## 2019-12-18 NOTE — Patient Instructions (Signed)
See me again in 2 months Happy Thanksgiving

## 2020-01-04 NOTE — Progress Notes (Signed)
Subjective:   Jackob Crookston. is a 69 y.o. male who presents for Medicare Annual/Subsequent preventive examination.    Review of Systems    N/A  Cardiac Risk Factors include: advanced age (>38men, >106 women);dyslipidemia     Objective:    Today's Vitals   01/07/20 0932  Weight: 184 lb (83.5 kg)   Body mass index is 26.4 kg/m.  Advanced Directives 01/07/2020  Does Patient Have a Medical Advance Directive? Yes  Type of Paramedic of [place];Living will  Does patient want to make changes to medical advance directive? No - Patient declined  Copy of [place] in Chart? No - copy requested    Current Medications (verified) Outpatient Encounter Medications as of 01/07/2020  Medication Sig  . Multiple Vitamins-Minerals (MULTIVITAMIN ADULTS PO) Take by mouth.  . Probiotic Product (PROBIOTIC PO) Take by mouth daily.   No facility-administered encounter medications on file as of 01/07/2020.    Allergies (verified) Other   History: Past Medical History:  Diagnosis Date  . BPH associated with nocturia   . Hyperlipidemia    Past Surgical History:  Procedure Laterality Date  . APPENDECTOMY  1973  . KNEE ARTHROSCOPY Left    x2  . ROTATOR CUFF REPAIR Bilateral    Family History  Problem Relation Age of Onset  . Sarcoidosis Mother        lung  . Heart failure Father 61  . Rheum arthritis Father   . Heart attack Maternal Grandfather 60  . Other Paternal Grandfather 38  . Gout Brother   . Diabetes Neg Hx    Social History   Socioeconomic History  . Marital status: Married    Spouse name: Not on file  . Number of children: Not on file  . Years of education: Not on file  . Highest education level: Not on file  Occupational History  . Not on file  Tobacco Use  . Smoking status: Never Smoker  . Smokeless tobacco: Never Used  Substance and Sexual Activity  . Alcohol use: Yes    Alcohol/week: 7.0 standard drinks     Types: 7 Standard drinks or equivalent per week    Comment: 2 glasses wine nightly; 4 shot liquor  . Drug use: No  . Sexual activity: Not on file  Other Topics Concern  . Not on file  Social History Narrative  . Not on file   Social Determinants of Health   Financial Resource Strain: Low Risk   . Difficulty of Paying Living Expenses: Not hard at all  Food Insecurity: No Food Insecurity  . Worried About Charity fundraiser in the Last Year: Never true  . Ran Out of Food in the Last Year: Never true  Transportation Needs: No Transportation Needs  . Lack of Transportation (Medical): No  . Lack of Transportation (Non-Medical): No  Physical Activity: Sufficiently Active  . Days of Exercise per Week: 7 days  . Minutes of Exercise per Session: 40 min  Stress: No Stress Concern Present  . Feeling of Stress : Not at all  Social Connections: Socially Integrated  . Frequency of Communication with Friends and Family: More than three times a week  . Frequency of Social Gatherings with Friends and Family: More than three times a week  . Attends Religious Services: More than 4 times per year  . Active Member of Clubs or Organizations: Yes  . Attends Archivist Meetings: More than 4 times per year  .  Marital Status: Married    Tobacco Counseling Counseling given: Not Answered   Clinical Intake:  Pre-visit preparation completed: Yes  Pain : No/denies pain     Nutritional Risks: None Diabetes: No  How often do you need to have someone help you when you read instructions, pamphlets, or other written materials from your doctor or pharmacy?: 1 - Never What is the last grade level you completed in school?: College  Diabetic?No   Interpreter Needed?: No  Information entered by :: [facility] of Daily Living In your present state of health, do you have any difficulty performing the following activities: 01/07/2020  Hearing? N  Vision? N  Difficulty  concentrating or making decisions? N  Walking or climbing stairs? N  Dressing or bathing? N  Doing errands, shopping? N  Preparing Food and eating ? N  Using the Toilet? N  In the past six months, have you accidently leaked urine? N  Do you have problems with loss of bowel control? N  Managing your Medications? N  Managing your Finances? N  Housekeeping or managing your Housekeeping? N  Some recent data might be hidden    Patient Care Team: [name], MD as PCP - General (Family Medicine) [name], MD as Consulting Physician (Dermatology) Monna Fam, MD as Consulting Physician (Ophthalmology)  Indicate any recent Medical Services you may have received from other than [facility] providers in the past year (date may be approximate).     Assessment:   This is a routine wellness examination for Delrae Hashim.  Hearing/Vision screen  Hearing Screening   125Hz  250Hz  500Hz  1000Hz  2000Hz  3000Hz  4000Hz  6000Hz  8000Hz   Right ear:           Left ear:           Vision Screening Comments: Patient states that he goes to eye doctor once per year. Sees Dr Herbert Deaner, has beginning stages of Cataracts currently    Dietary issues and exercise activities discussed: Current Exercise Habits: Home exercise routine, Type of exercise: walking;strength training/weights;yoga;stretching, Time (Minutes): 45, Frequency (Times/Week): 7, Weekly Exercise (Minutes/Week): 315, Intensity: Moderate  Goals    . Patient Stated     I would like to continue exercising daily     . Weight (lb) < 175 lb (79.4 kg)      Depression Screen PHQ 2/9 Scores 01/07/2020 12/06/2018 12/09/2017 11/30/2016 11/06/2015 11/05/2014  PHQ - 2 Score 0 0 0 0 0 0  PHQ- 9 Score 0 - - - - -    Fall Risk Fall Risk  01/07/2020 12/22/2018 12/06/2018 12/09/2017 11/30/2016  Falls in the past year? 0 0 0 0 No  Comment - Emmi Telephone Survey: data to providers prior to load - - -  Number falls in past yr: 0 - 0 0 -  Injury with Fall? 0 - 0 0  -  Risk for fall due to : No Fall Risks - - - -  Follow up Falls evaluation completed;Falls prevention discussed - - - -    Any stairs in or around the home? Yes  If so, are there any without handrails? No  Home free of loose throw rugs in walkways, pet beds, electrical cords, etc? Yes  Adequate lighting in your home to reduce risk of falls? Yes   ASSISTIVE DEVICES UTILIZED TO PREVENT FALLS:  Life alert? No  Use of a cane, walker or w/c? No  Grab bars in the bathroom? No  Shower chair or bench in shower? No  Elevated toilet seat or a handicapped toilet? No    Cognitive Function:  Cognitive screening not indicated based on direct observation      Immunizations Immunization History  Administered Date(s) Administered  . Fluad Quad(high Dose 65+) 10/24/2018, 01/07/2020  . Influenza Split 10/12/2011  . Influenza Whole 11/01/2008  . Influenza, High Dose Seasonal PF 11/05/2014, 12/02/2015, 11/30/2016, 12/09/2017  . Influenza,inj,Quad PF,6+ Mos 02/12/2013  . Influenza-Unspecified 12/09/2017  . PFIZER SARS-COV-2 Vaccination 03/09/2019, 04/03/2019  . Pneumococcal Conjugate-13 11/30/2016  . Pneumococcal Polysaccharide-23 12/09/2017  . Td 02/01/2002  . Tdap 04/17/2013    TDAP status: Up to date Flu Vaccine status: Completed at today's visit Pneumococcal vaccine status: Up to date Covid-19 vaccine status: Completed vaccines  Qualifies for Shingles Vaccine? Yes   Zostavax completed No   Shingrix Completed?: No.    Education has been provided regarding the importance of this vaccine. Patient has been advised to call insurance company to determine out of pocket expense if they have not yet received this vaccine. Advised may also receive vaccine at local pharmacy or Health Dept. Verbalized acceptance and understanding.  Screening Tests Health Maintenance  Topic Date Due  . COLONOSCOPY  02/27/2022 (Originally 02/01/2022)  . TETANUS/TDAP  04/18/2023  . INFLUENZA VACCINE  Completed    . COVID-19 Vaccine  Completed  . Hepatitis C Screening  Completed  . PNA vac Low Risk Adult  Completed    Health Maintenance  There are no preventive care reminders to display for this patient.  Colorectal cancer screening: Completed 02/02/2012. Repeat every 10 years  Lung Cancer Screening: (Low Dose CT Chest recommended if Age 68-80 years, 30 pack-year currently smoking OR have quit w/in 15years.) does not qualify.   Lung Cancer Screening Referral: N/A   Additional Screening:  Hepatitis C Screening: does qualify; Completed 11/30/2016  Vision Screening: Recommended annual ophthalmology exams for early detection of glaucoma and other disorders of the eye. Is the patient up to date with their annual eye exam?  Yes  Who is the provider or what is the name of the office in which the patient attends annual eye exams? Dr. Herbert Deaner If pt is not established with a provider, would they like to be referred to a provider to establish care? No .   Dental Screening: Recommended annual dental exams for proper oral hygiene  Community Resource Referral / Chronic Care Management: CRR required this visit?  No   CCM required this visit?  No      Plan:     I have personally reviewed and noted the following in the patient's chart:   . Medical and social history . Use of alcohol, tobacco or illicit drugs  . Current medications and supplements . Functional ability and status . Nutritional status . Physical activity . Advanced directives . List of other physicians . Hospitalizations, surgeries, and ER visits in previous 12 months . Vitals . Screenings to include cognitive, depression, and falls . Referrals and appointments  In addition, I have reviewed and discussed with patient certain preventive protocols, quality metrics, and best practice recommendations. A written personalized care plan for preventive services as well as general preventive health recommendations were provided to  patient.     Ofilia Neas, LPN   [id number]   Nurse Notes: None

## 2020-01-07 ENCOUNTER — Ambulatory Visit (INDEPENDENT_AMBULATORY_CARE_PROVIDER_SITE_OTHER): Payer: Medicare Other

## 2020-01-07 ENCOUNTER — Other Ambulatory Visit: Payer: Self-pay

## 2020-01-07 VITALS — Wt 184.0 lb

## 2020-01-07 DIAGNOSIS — Z23 Encounter for immunization: Secondary | ICD-10-CM | POA: Diagnosis not present

## 2020-01-07 DIAGNOSIS — Z0000 Encounter for general adult medical examination without abnormal findings: Secondary | ICD-10-CM | POA: Diagnosis not present

## 2020-01-07 NOTE — Patient Instructions (Signed)
Jacob [name] , Thank you for taking time to come for your Medicare Wellness Visit. I appreciate your ongoing commitment to your health goals. Please review the following plan we discussed and let me know if I can assist you in the future.   Screening recommendations/referrals: Colonoscopy: Up to date, next due 02/2022 Recommended yearly ophthalmology/optometry visit for glaucoma screening and checkup Recommended yearly dental visit for hygiene and checkup  Vaccinations: Influenza vaccine: Up to date, next due fall 2022 Pneumococcal vaccine: Completed series  Tdap vaccine: Up to date, next due 04/18/2023 Shingles vaccine:Completed series     Advanced directives: Please bring in copies of your advanced medical directives into our office so that we may scan them into your chart   Conditions/risks identified: None   Next appointment: 02/20/2020 @ 2:00 PM with Dr. Ethlyn Gallery  Preventive Care 65 Years and Older, Male Preventive care refers to lifestyle choices and visits with your health care provider that can promote health and wellness. What does preventive care include?  A yearly physical exam. This is also called an annual well check.  Dental exams once or twice a year.  Routine eye exams. Ask your health care provider how often you should have your eyes checked.  Personal lifestyle choices, including:  Daily care of your teeth and gums.  Regular physical activity.  Eating a healthy diet.  Avoiding tobacco and drug use.  Limiting alcohol use.  Practicing safe sex.  Taking low doses of aspirin every day.  Taking vitamin and mineral supplements as recommended by your health care provider. What happens during an annual well check? The services and screenings done by your health care provider during your annual well check will depend on your age, overall health, lifestyle risk factors, and family history of disease. Counseling  Your health care provider may ask you questions  about your:  Alcohol use.  Tobacco use.  Drug use.  Emotional well-being.  Home and relationship well-being.  Sexual activity.  Eating habits.  History of falls.  Memory and ability to understand (cognition).  Work and work Statistician. Screening  You may have the following tests or measurements:  Height, weight, and BMI.  Blood pressure.  Lipid and cholesterol levels. These may be checked every 5 years, or more frequently if you are over 69 years old.  Skin check.  Lung cancer screening. You may have this screening every year starting at age 15 if you have a 30-pack-year history of smoking and currently smoke or have quit within the past 15 years.  Fecal occult blood test (FOBT) of the stool. You may have this test every year starting at age 23.  Flexible sigmoidoscopy or colonoscopy. You may have a sigmoidoscopy every 5 years or a colonoscopy every 10 years starting at age 104.  Prostate cancer screening. Recommendations will vary depending on your family history and other risks.  Hepatitis C blood test.  Hepatitis B blood test.  Sexually transmitted disease (STD) testing.  Diabetes screening. This is done by checking your blood sugar (glucose) after you have not eaten for a while (fasting). You may have this done every 1-3 years.  Abdominal aortic aneurysm (AAA) screening. You may need this if you are a current or former smoker.  Osteoporosis. You may be screened starting at age 6 if you are at high risk. Talk with your health care provider about your test results, treatment options, and if necessary, the need for more tests. Vaccines  Your health care provider may recommend certain  vaccines, such as:  Influenza vaccine. This is recommended every year.  Tetanus, diphtheria, and acellular pertussis (Tdap, Td) vaccine. You may need a Td booster every 10 years.  Zoster vaccine. You may need this after age 32.  Pneumococcal 13-valent conjugate (PCV13)  vaccine. One dose is recommended after age 54.  Pneumococcal polysaccharide (PPSV23) vaccine. One dose is recommended after age 17. Talk to your health care provider about which screenings and vaccines you need and how often you need them. This information is not intended to replace advice given to you by your health care provider. Make sure you discuss any questions you have with your health care provider. Document Released: 02/14/2015 Document Revised: 10/08/2015 Document Reviewed: 11/19/2014 Elsevier Interactive Patient Education  2017 [place] Prevention in the Home Falls can cause injuries. They can happen to people of all ages. There are many things you can do to make your home safe and to help prevent falls. What can I do on the outside of my home?  Regularly fix the edges of walkways and driveways and fix any cracks.  Remove anything that might make you trip as you walk through a door, such as a raised step or threshold.  Trim any bushes or trees on the path to your home.  Use bright outdoor lighting.  Clear any walking paths of anything that might make someone trip, such as rocks or tools.  Regularly check to see if handrails are loose or broken. Make sure that both sides of any steps have handrails.  Any raised decks and porches should have guardrails on the edges.  Have any leaves, snow, or ice cleared regularly.  Use sand or salt on walking paths during winter.  Clean up any spills in your garage right away. This includes oil or grease spills. What can I do in the bathroom?  Use night lights.  Install grab bars by the toilet and in the tub and shower. Do not use towel bars as grab bars.  Use non-skid mats or decals in the tub or shower.  If you need to sit down in the shower, use a plastic, non-slip stool.  Keep the floor dry. Clean up any water that spills on the floor as soon as it happens.  Remove soap buildup in the tub or shower  regularly.  Attach bath mats securely with double-sided non-slip rug tape.  Do not have throw rugs and other things on the floor that can make you trip. What can I do in the bedroom?  Use night lights.  Make sure that you have a light by your bed that is easy to reach.  Do not use any sheets or blankets that are too big for your bed. They should not hang down onto the floor.  Have a firm chair that has side arms. You can use this for support while you get dressed.  Do not have throw rugs and other things on the floor that can make you trip. What can I do in the kitchen?  Clean up any spills right away.  Avoid walking on wet floors.  Keep items that you use a lot in easy-to-reach places.  If you need to reach something above you, use a strong step stool that has a grab bar.  Keep electrical cords out of the way.  Do not use floor polish or wax that makes floors slippery. If you must use wax, use non-skid floor wax.  Do not have throw rugs and other things  on the floor that can make you trip. What can I do with my stairs?  Do not leave any items on the stairs.  Make sure that there are handrails on both sides of the stairs and use them. Fix handrails that are broken or loose. Make sure that handrails are as long as the stairways.  Check any carpeting to make sure that it is firmly attached to the stairs. Fix any carpet that is loose or worn.  Avoid having throw rugs at the top or bottom of the stairs. If you do have throw rugs, attach them to the floor with carpet tape.  Make sure that you have a light switch at the top of the stairs and the bottom of the stairs. If you do not have them, ask someone to add them for you. What else can I do to help prevent falls?  Wear shoes that:  Do not have high heels.  Have rubber bottoms.  Are comfortable and fit you well.  Are closed at the toe. Do not wear sandals.  If you use a stepladder:  Make sure that it is fully  opened. Do not climb a closed stepladder.  Make sure that both sides of the stepladder are locked into place.  Ask someone to hold it for you, if possible.  Clearly mark and make sure that you can see:  Any grab bars or handrails.  First and last steps.  Where the edge of each step is.  Use tools that help you move around (mobility aids) if they are needed. These include:  Canes.  Walkers.  Scooters.  Crutches.  Turn on the lights when you go into a dark area. Replace any light bulbs as soon as they burn out.  Set up your furniture so you have a clear path. Avoid moving your furniture around.  If any of your floors are uneven, fix them.  If there are any pets around you, be aware of where they are.  Review your medicines with your doctor. Some medicines can make you feel dizzy. This can increase your chance of falling. Ask your doctor what other things that you can do to help prevent falls. This information is not intended to replace advice given to you by your health care provider. Make sure you discuss any questions you have with your health care provider. Document Released: 11/14/2008 Document Revised: 06/26/2015 Document Reviewed: 02/22/2014 Elsevier Interactive Patient Education  2017 [employer].

## 2020-02-14 NOTE — Progress Notes (Deleted)
  [place] Phone: [phone] Subjective:    I'm seeing this patient by the request  of:  [name], MD  CC:   [id number]  Jacob [name]. is a 70 y.o. male coming in with complaint of back and neck pain. OMT 12/18/2019. Patient states   Medications patient has been prescribed:   Taking:         Reviewed prior external information including notes and imaging from previsou exam, outside providers and external EMR if available.   As well as notes that were available from care everywhere and other healthcare systems.  Past medical history, social, surgical and family history all reviewed in electronic medical record.  No pertanent information unless stated regarding to the chief complaint.   Past Medical History:  Diagnosis Date  . BPH associated with nocturia   . Hyperlipidemia     Allergies  Allergen Reactions  . Other Other (See Comments)    Pet dander, tree mold, ragweed, pollen cause sneezing and drainage     Review of Systems:  No headache, visual changes, nausea, vomiting, diarrhea, constipation, dizziness, abdominal pain, skin rash, fevers, chills, night sweats, weight loss, swollen lymph nodes, body aches, joint swelling, chest pain, shortness of breath, mood changes. POSITIVE muscle aches  Objective  There were no vitals taken for this visit.   General: No apparent distress alert and oriented x3 mood and affect normal, dressed appropriately.  HEENT: Pupils equal, extraocular movements intact  Respiratory: Patient's speak in full sentences and does not appear short of breath  Cardiovascular: No lower extremity edema, non tender, no erythema  Neuro: Cranial nerves II through XII are intact, neurovascularly intact in all extremities with 2+ DTRs and 2+ pulses.  Gait normal with good balance and coordination.  MSK:  Non tender with full range of motion and good stability and  symmetric strength and tone of shoulders, elbows, wrist, hip, knee and ankles bilaterally.  Back - Normal skin, Spine with normal alignment and no deformity.  No tenderness to vertebral process palpation.  Paraspinous muscles are not tender and without spasm.   Range of motion is full at neck and lumbar sacral regions  Osteopathic findings  C2 flexed rotated and side bent right C6 flexed rotated and side bent left T3 extended rotated and side bent right inhaled rib T9 extended rotated and side bent left L2 flexed rotated and side bent right Sacrum right on right       Assessment and Plan:    Nonallopathic problems  Decision today to treat with OMT was based on Physical Exam  After verbal consent patient was treated with HVLA, ME, FPR techniques in cervical, rib, thoracic, lumbar, and sacral  areas  Patient tolerated the procedure well with improvement in symptoms  Patient given exercises, stretches and lifestyle modifications  See medications in patient instructions if given  Patient will follow up in 4-8 weeks      The above documentation has been reviewed and is accurate and complete Jacob [name]       Note: This dictation was prepared with Dragon dictation along with smaller phrase technology. Any transcriptional errors that result from this process are unintentional.

## 2020-02-19 ENCOUNTER — Encounter: Payer: Self-pay | Admitting: Family Medicine

## 2020-02-19 ENCOUNTER — Other Ambulatory Visit: Payer: Self-pay

## 2020-02-19 ENCOUNTER — Ambulatory Visit (INDEPENDENT_AMBULATORY_CARE_PROVIDER_SITE_OTHER): Payer: Medicare Other | Admitting: Family Medicine

## 2020-02-19 ENCOUNTER — Ambulatory Visit: Payer: Medicare Other | Admitting: Family Medicine

## 2020-02-19 VITALS — BP 138/84 | HR 69 | Ht 70.0 in | Wt 190.0 lb

## 2020-02-19 DIAGNOSIS — E782 Mixed hyperlipidemia: Secondary | ICD-10-CM

## 2020-02-19 DIAGNOSIS — R030 Elevated blood-pressure reading, without diagnosis of hypertension: Secondary | ICD-10-CM

## 2020-02-19 DIAGNOSIS — R351 Nocturia: Secondary | ICD-10-CM

## 2020-02-19 DIAGNOSIS — M999 Biomechanical lesion, unspecified: Secondary | ICD-10-CM

## 2020-02-19 DIAGNOSIS — M533 Sacrococcygeal disorders, not elsewhere classified: Secondary | ICD-10-CM

## 2020-02-19 DIAGNOSIS — N401 Enlarged prostate with lower urinary tract symptoms: Secondary | ICD-10-CM

## 2020-02-19 MED ORDER — GABAPENTIN 100 MG PO CAPS: 200.0000 mg | ORAL_CAPSULE | Freq: Every day | ORAL | 0 refills | Status: DC

## 2020-02-19 NOTE — Progress Notes (Signed)
[place] Phone: [phone] Subjective:   Jacob [name], am serving as a scribe for Dr. Hulan [name]. This visit occurred during the SARS-CoV-2 public health emergency.  Safety protocols were in place, including screening questions prior to the visit, additional usage of staff PPE, and extensive cleaning of exam room while observing appropriate contact time as indicated for disinfecting solutions.   I'm seeing this patient by the request  of:  Jacob Macadam, MD  CC: Neck and back pain follow-up  [id number]  Jacob [name]. is a 70 y.o. male coming in with complaint of back and neck pain. OMT 12/18/2019. Patient states that he has some nagging sciatic nerve pain in right glute. Sitting in car exacerbates his pain. Does feel stronger since last visit.  Patient states that it is annoying but not truly does debilitating at this moment  Medications patient has been prescribed: None        X-rays of the lumbar spine were taken on February 09, 2019.  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  Reviewed prior external information including notes and imaging from previsou exam, outside providers and external EMR if available.   As well as notes that were available from care everywhere and other healthcare systems.  Past medical history, social, surgical and family history all reviewed in electronic medical record.  [name] pertanent information unless stated regarding to the chief complaint.   Past Medical History:  Diagnosis Date  . BPH associated with nocturia   . Hyperlipidemia     Allergies  Allergen Reactions  . Other Other (See Comments)    Pet dander, tree mold, ragweed, pollen cause sneezing and drainage     Review of Systems:  [name] headache, visual changes, nausea, vomiting, diarrhea, constipation, dizziness, abdominal pain, skin rash, fevers,  chills, night sweats, weight loss, swollen lymph nodes, body aches, joint swelling, chest pain, shortness of breath, mood changes. POSITIVE muscle aches  Objective  Blood pressure 138/84, pulse 69, height 5\' 10"  (1.778 m), weight 190 lb (86.2 kg), SpO2 99 %.   General: [name] apparent distress alert and oriented x3 mood and affect normal, dressed appropriately.  HEENT: Pupils equal, extraocular movements intact  Respiratory: Patient's speak in full sentences and does not appear short of breath  Cardiovascular: [name] lower extremity edema, non tender, [name] erythema  Neuro: Cranial nerves II through XII are intact, neurovascularly intact in all extremities with 2+ DTRs and 2+ pulses.  Gait normal with good balance and coordination.  MSK:  Non tender with full range of motion and good stability and symmetric strength and tone of shoulders, elbows, wrist, hip, knee and ankles bilaterally.  Back -low back exam does show the patient does have tightness noted of the lumbar spine moderately.  Mild tightness around the right sacroiliac joint.  Tightness of the hamstring compared to the contralateral side.  Mild increase in discomfort with extension of the back.  Osteopathic findings  T9 extended rotated and side bent left L2 flexed rotated and side bent right Sacrum right on right       Assessment and Plan:  SI (sacroiliac) joint dysfunction Continues to have more discomfort more of the hamstring at this moment as well as the sacroiliac joint right greater than left.  Patient has responded relatively well though to home exercises.  I did decide to start patient on a Larach very low-dose  of gabapentin.  We will started on 200 mg at night to see how much of this could be more neurovascular.  Patient's x-rays have not shown significant amount of arthritic changes previously.  Worsening pain though or [name] improvement consider possible MRI to further evaluate the lumbar spine and the hamstring if appropriate.   Follow-up again in 6 to 8 weeks    Nonallopathic problems  Decision today to treat with OMT was based on Physical Exam  After verbal consent patient was treated with HVLA, ME, FPR techniques in thoracic, lumbar, and sacral  areas  Patient tolerated the procedure well with improvement in symptoms  Patient given exercises, stretches and lifestyle modifications  See medications in patient instructions if given  Patient will follow up in 8 weeks      The above documentation has been reviewed and is accurate and complete Jacob Pulley, DO       Note: This dictation was prepared with Dragon dictation along with smaller phrase technology. Any transcriptional errors that result from this process are unintentional.

## 2020-02-19 NOTE — Patient Instructions (Addendum)
Gabapentin start 100mg  at night can increase to 200mg  Keep doing exercises See me again in 7-8 weeks

## 2020-02-19 NOTE — Assessment & Plan Note (Signed)
Continues to have more discomfort more of the hamstring at this moment as well as the sacroiliac joint right greater than left.  Patient has responded relatively well though to home exercises.  I did decide to start patient on a Larach very low-dose of gabapentin.  We will started on 200 mg at night to see how much of this could be more neurovascular.  Patient's x-rays have not shown significant amount of arthritic changes previously.  Worsening pain though or no improvement consider possible MRI to further evaluate the lumbar spine and the hamstring if appropriate.  Follow-up again in 6 to 8 weeks

## 2020-02-20 ENCOUNTER — Encounter: Payer: Medicare Other | Admitting: Family Medicine

## 2020-02-21 NOTE — Telephone Encounter (Signed)
Spoke with the pt and he stated he already scheduled a lab appt for next week and has an appt for a physical in March.

## 2020-02-26 ENCOUNTER — Other Ambulatory Visit: Payer: Self-pay

## 2020-02-26 ENCOUNTER — Other Ambulatory Visit (INDEPENDENT_AMBULATORY_CARE_PROVIDER_SITE_OTHER): Payer: Medicare Other

## 2020-02-26 DIAGNOSIS — R351 Nocturia: Secondary | ICD-10-CM | POA: Diagnosis not present

## 2020-02-26 DIAGNOSIS — N401 Enlarged prostate with lower urinary tract symptoms: Secondary | ICD-10-CM | POA: Diagnosis not present

## 2020-02-26 DIAGNOSIS — E782 Mixed hyperlipidemia: Secondary | ICD-10-CM

## 2020-02-26 DIAGNOSIS — R030 Elevated blood-pressure reading, without diagnosis of hypertension: Secondary | ICD-10-CM | POA: Diagnosis not present

## 2020-02-26 LAB — CBC WITH DIFFERENTIAL/PLATELET
Basophils Absolute: 0.1 10*3/uL (ref 0.0–0.1)
Basophils Relative: 1.6 % (ref 0.0–3.0)
Eosinophils Absolute: 0.1 10*3/uL (ref 0.0–0.7)
Eosinophils Relative: 2.4 % (ref 0.0–5.0)
HCT: 43.3 % (ref 39.0–52.0)
Hemoglobin: 14.5 g/dL (ref 13.0–17.0)
Lymphocytes Relative: 33.8 % (ref 12.0–46.0)
Lymphs Abs: 1.8 10*3/uL (ref 0.7–4.0)
MCHC: 33.6 g/dL (ref 30.0–36.0)
MCV: 91.4 fl (ref 78.0–100.0)
Monocytes Absolute: 0.5 10*3/uL (ref 0.1–1.0)
Monocytes Relative: 8.6 % (ref 3.0–12.0)
Neutro Abs: 2.9 10*3/uL (ref 1.4–7.7)
Neutrophils Relative %: 53.6 % (ref 43.0–77.0)
Platelets: 204 10*3/uL (ref 150.0–400.0)
RBC: 4.73 Mil/uL (ref 4.22–5.81)
RDW: 14.1 % (ref 11.5–15.5)
WBC: 5.4 10*3/uL (ref 4.0–10.5)

## 2020-02-26 LAB — COMPREHENSIVE METABOLIC PANEL
ALT: 16 U/L (ref 0–53)
AST: 15 U/L (ref 0–37)
Albumin: 4.5 g/dL (ref 3.5–5.2)
Alkaline Phosphatase: 41 U/L (ref 39–117)
BUN: 20 mg/dL (ref 6–23)
CO2: 32 mEq/L (ref 19–32)
Calcium: 9.7 mg/dL (ref 8.4–10.5)
Chloride: 103 mEq/L (ref 96–112)
Creatinine, Ser: 1.17 mg/dL (ref 0.40–1.50)
GFR: 62.74 mL/min (ref >60.00)
Glucose, Bld: 95 mg/dL (ref 70–99)
Potassium: 4.3 mEq/L (ref 3.5–5.1)
Sodium: 139 mEq/L (ref 135–145)
Total Bilirubin: 0.5 mg/dL (ref 0.2–1.2)
Total Protein: 6.6 g/dL (ref 6.0–8.3)

## 2020-02-26 LAB — LIPID PANEL
Cholesterol: 202 mg/dL — ABNORMAL HIGH (ref 0–200)
HDL: 62.6 mg/dL (ref >39.00)
LDL Cholesterol: 116 mg/dL — ABNORMAL HIGH (ref 0–99)
NonHDL: 139.84
Total CHOL/HDL Ratio: 3
Triglycerides: 118 mg/dL (ref 0.0–149.0)
VLDL: 23.6 mg/dL (ref 0.0–40.0)

## 2020-02-26 LAB — PSA: PSA: 2.15 ng/mL (ref 0.10–4.00)

## 2020-04-07 NOTE — Progress Notes (Signed)
[place] [address] [place] Phone: [phone] Subjective:   I Jacob [name] am serving as a Education administrator for Dr. Hulan Saas.  This visit occurred during the SARS-CoV-2 public health emergency.  Safety protocols were in place, including screening questions prior to the visit, additional usage of staff PPE, and extensive cleaning of exam room while observing appropriate contact time as indicated for disinfecting solutions.   I'm seeing this patient by the request  of:  Koberlein, Steele Berg, MD  CC: Low back pain follow-up  [id number]  Tyrez Berrios. is a 70 y.o. male coming in with complaint of piriformis pain. OMT 02/19/2020. States he is back to his normal activities. States with driving for a few hours the sciatic nerve flares up.  Patient states other than that has been very active.  Patient has been able to workout on a regular basis, patient has not had to take any medications.  He was having mood swings with the gabapentin.  Medications patient has been prescribed: Gabapentin  Taking: No         Reviewed prior external information including notes and imaging from previsou exam, outside providers and external EMR if available.   As well as notes that were available from care everywhere and other healthcare systems.  Past medical history, social, surgical and family history all reviewed in electronic medical record.  No pertanent information unless stated regarding to the chief complaint.   Past Medical History:  Diagnosis Date  . BPH associated with nocturia   . Hyperlipidemia     Allergies  Allergen Reactions  . Other Other (See Comments)    Pet dander, tree mold, ragweed, pollen cause sneezing and drainage     Review of Systems:  No headache, visual changes, nausea, vomiting, diarrhea, constipation, dizziness, abdominal pain, skin rash, fevers, chills, night sweats, weight loss, swollen lymph nodes, body aches,  joint swelling, chest pain, shortness of breath, mood changes  Objective  Blood pressure 140/90, pulse 60, height 5\' 10"  (1.778 m), weight 191 lb (86.6 kg), SpO2 100 %.   General: No apparent distress alert and oriented x3 mood and affect normal, dressed appropriately.  HEENT: Pupils equal, extraocular movements intact  Respiratory: Patient's speak in full sentences and does not appear short of breath  Cardiovascular: No lower extremity edema, non tender, no erythema  Gait normal with good balance and coordination.  MSK:  Non tender with full range of motion and good stability and symmetric strength and tone of shoulders, elbows, wrist, hip, knee and ankles bilaterally.  Back -low back exam does have some loss of lordosis.  Patient does have tenderness to palpation in the paraspinal musculature of the lumbar spine very minorly.  Patient has had improvement though in the range of motion overall.  Still some tightness with the FABER test right greater than left  Osteopathic findings   T11 extended rotated and side bent right L1 flexed rotated and side bent right Sacrum right on right       Assessment and Plan: SI (sacroiliac) joint dysfunction Patient is doing remarkably well overall.  Patient is not taking any medications, and staying very active.  Patient will come back and see me again in 4 months.  Has responded very well to osteopathic manipulation.     Nonallopathic problems  Decision today to treat with OMT was based on Physical Exam  After verbal consent patient was treated with HVLA, ME, FPR techniques in  thoracic,  lumbar, and sacral  areas  Patient tolerated the procedure well with improvement in symptoms  Patient given exercises, stretches and lifestyle modifications  See medications in patient instructions if given  Patient will follow up in 4 months       The above documentation has been reviewed and is accurate and complete [name], DO        Note: This dictation was prepared with Dragon dictation along with smaller phrase technology. Any transcriptional errors that result from this process are unintentional.

## 2020-04-08 ENCOUNTER — Ambulatory Visit (INDEPENDENT_AMBULATORY_CARE_PROVIDER_SITE_OTHER): Payer: Medicare Other | Admitting: Family Medicine

## 2020-04-08 ENCOUNTER — Encounter: Payer: Self-pay | Admitting: Family Medicine

## 2020-04-08 ENCOUNTER — Other Ambulatory Visit: Payer: Self-pay

## 2020-04-08 VITALS — BP 140/90 | HR 60 | Ht 70.0 in | Wt 191.0 lb

## 2020-04-08 DIAGNOSIS — M533 Sacrococcygeal disorders, not elsewhere classified: Secondary | ICD-10-CM | POA: Diagnosis not present

## 2020-04-08 DIAGNOSIS — M999 Biomechanical lesion, unspecified: Secondary | ICD-10-CM | POA: Diagnosis not present

## 2020-04-08 NOTE — Patient Instructions (Addendum)
Good to see you Tennis ball in back pocket You're doing great No real changes  See me again in 4 months

## 2020-04-08 NOTE — Assessment & Plan Note (Signed)
Patient is doing remarkably well overall.  Patient is not taking any medications, and staying very active.  Patient will come back and see me again in 4 months.  Has responded very well to osteopathic manipulation.

## 2020-04-09 ENCOUNTER — Ambulatory Visit (INDEPENDENT_AMBULATORY_CARE_PROVIDER_SITE_OTHER): Payer: Medicare Other | Admitting: Family Medicine

## 2020-04-09 ENCOUNTER — Other Ambulatory Visit: Payer: Self-pay

## 2020-04-09 ENCOUNTER — Encounter: Payer: Self-pay | Admitting: Family Medicine

## 2020-04-09 VITALS — BP 130/70 | HR 62 | Temp 98.1°F | Ht 71.25 in | Wt 192.1 lb

## 2020-04-09 DIAGNOSIS — R209 Unspecified disturbances of skin sensation: Secondary | ICD-10-CM | POA: Diagnosis not present

## 2020-04-09 DIAGNOSIS — E538 Deficiency of other specified B group vitamins: Secondary | ICD-10-CM

## 2020-04-09 DIAGNOSIS — R351 Nocturia: Secondary | ICD-10-CM

## 2020-04-09 DIAGNOSIS — N401 Enlarged prostate with lower urinary tract symptoms: Secondary | ICD-10-CM | POA: Diagnosis not present

## 2020-04-09 DIAGNOSIS — J309 Allergic rhinitis, unspecified: Secondary | ICD-10-CM

## 2020-04-09 DIAGNOSIS — E782 Mixed hyperlipidemia: Secondary | ICD-10-CM

## 2020-04-09 LAB — TSH: TSH: 1.69 u[IU]/mL (ref 0.35–4.50)

## 2020-04-09 LAB — VITAMIN B12: Vitamin B-12: 201 pg/mL — ABNORMAL LOW (ref 211–911)

## 2020-04-09 NOTE — Progress Notes (Signed)
Jacob [name]. DOB: 1950-07-17 Encounter date: 04/09/2020  This is a 70 y.o. male who presents for complete physical   History of present illness/Additional concerns:  Would like to take some weight off - would like to target 175lb. Feels easier on joints, seems like it helps with energy. Has used app my fitness pal in the past. Restarted doing this about 10 days ago - still more fat, not enough protein. Carb calories are way down. Calorie goal is just under 2100. Hasn't been as active in last 6-8 months in dealing with injury with Dr. Tamala Julian; but feels he is in good place with this now. Able to walk full weekend with golf; will get back on bike.   At night when going to bed end of feet are cold and feel a little stiff. Massage seems to help with this. No soreness in feet. Warm up with massage. Rarely will get cramp in leg - like if he walks 18 holes of golf. Massage helps in a couple of minutes.   Blood pressure and cholesterol are diet controlled.  Follows with ophthalmology (Dr. Monna Fam) regularly for eye exams.    Follows with Dr. Ubaldo Glassing for dermatology skin checks.  Last blood work was completed 02/26/2020.  CBC, CMP normal.  LDL 116, HDL 62.  Normal PSA at 2.15.  Last colonoscopy 12/2012.  Repeat in 10 years (02/2022)  Past Medical History:  Diagnosis Date  . BPH associated with nocturia   . Hyperlipidemia    Past Surgical History:  Procedure Laterality Date  . APPENDECTOMY  1973  . KNEE ARTHROSCOPY Left    x2  . ROTATOR CUFF REPAIR Bilateral    Allergies  Allergen Reactions  . Other Other (See Comments)    Pet dander, tree mold, ragweed, pollen cause sneezing and drainage   Current Meds  Medication Sig  . Cetirizine HCl (ZYRTEC ALLERGY PO) Take by mouth daily.  . Multiple Vitamins-Minerals (MULTIVITAMIN ADULTS PO) Take by mouth.  . Probiotic Product (PROBIOTIC PO) Take by mouth daily.   Social History   Tobacco Use  . Smoking status: Former Smoker    Types:  Cigarettes    Quit date: 1971    Years since quitting: 51.2  . Smokeless tobacco: Never Used  Substance Use Topics  . Alcohol use: Yes    Alcohol/week: 7.0 standard drinks    Types: 7 Standard drinks or equivalent per week    Comment: 2 glasses wine nightly; 4 shot liquor   Family History  Problem Relation Age of Onset  . Sarcoidosis Mother        lung  . Heart failure Father 110  . Rheum arthritis Father   . Heart attack Maternal Grandfather 60  . Other Paternal Grandfather 35  . Gout Brother   . Diabetes Neg Hx      Review of Systems  Constitutional: Negative for activity change, appetite change, chills, fatigue, fever and unexpected weight change.  HENT: Positive for congestion (during spring months). Negative for ear pain, hearing loss, sinus pressure, sore throat and trouble swallowing.   Eyes: Negative for pain and visual disturbance.  Respiratory: Negative for cough, chest tightness, shortness of breath and wheezing.   Cardiovascular: Negative for chest pain, palpitations and leg swelling.  Gastrointestinal: Negative for abdominal distention, abdominal pain, blood in stool, constipation, diarrhea, nausea and vomiting.  Genitourinary: Negative for decreased urine volume, difficulty urinating, dysuria, penile pain and testicular pain.  Musculoskeletal: Negative for arthralgias, back pain and joint swelling.  Skin: Negative for rash.  Neurological: Negative for dizziness, weakness, numbness and headaches.  Hematological: Negative for adenopathy. Does not bruise/bleed easily.  Psychiatric/Behavioral: Negative for agitation, sleep disturbance and suicidal ideas. The patient is not nervous/anxious.     CBC:  Lab Results  Component Value Date   WBC 5.4 02/26/2020   HGB 14.5 02/26/2020   HCT 43.3 02/26/2020   MCHC 33.6 02/26/2020   RDW 14.1 02/26/2020   PLT 204.0 02/26/2020   CMP: Lab Results  Component Value Date   NA 139 02/26/2020   K 4.3 02/26/2020   CL 103  02/26/2020   CO2 32 02/26/2020   GLUCOSE 95 02/26/2020   BUN 20 02/26/2020   CREATININE 1.17 02/26/2020   GFRAA 89 09/07/2006   CALCIUM 9.7 02/26/2020   PROT 6.6 02/26/2020   BILITOT 0.5 02/26/2020   ALKPHOS 41 02/26/2020   ALT 16 02/26/2020   AST 15 02/26/2020   LIPID: Lab Results  Component Value Date   CHOL 202 (H) 02/26/2020   TRIG 118.0 02/26/2020   HDL 62.60 02/26/2020   LDLCALC 116 (H) 02/26/2020    Objective:  BP 130/70 (BP Location: Left Arm, Patient Position: Sitting, Cuff Size: Large)   Pulse 62   Temp 98.1 F (36.7 C) (Oral)   Ht 5' 11.25" (1.81 m)   Wt 192 lb 1.6 oz (87.1 kg)   SpO2 98%   BMI 26.60 kg/m   Weight: 192 lb 1.6 oz (87.1 kg)   BP Readings from Last 3 Encounters:  04/09/20 130/70  04/08/20 140/90  02/19/20 138/84   Wt Readings from Last 3 Encounters:  04/09/20 192 lb 1.6 oz (87.1 kg)  04/08/20 191 lb (86.6 kg)  02/19/20 190 lb (86.2 kg)    Physical Exam Constitutional:      General: He is not in acute distress.    Appearance: He is well-developed and well-nourished.  HENT:     Head: Normocephalic and atraumatic.     Right Ear: External ear normal.     Left Ear: External ear normal.     Nose: Nose normal.     Mouth/Throat:     Mouth: Oropharynx is clear and moist.     Pharynx: No oropharyngeal exudate.  Eyes:     Conjunctiva/sclera: Conjunctivae normal.     Pupils: Pupils are equal, round, and reactive to light.  Neck:     Thyroid: No thyromegaly.  Cardiovascular:     Rate and Rhythm: Normal rate and regular rhythm.     Pulses: Intact distal pulses.     Heart sounds: Normal heart sounds. No murmur heard. No friction rub. No gallop.   Pulmonary:     Effort: Pulmonary effort is normal. No respiratory distress.     Breath sounds: Normal breath sounds. No stridor. No wheezing or rales.  Abdominal:     General: Bowel sounds are normal.     Palpations: Abdomen is soft.  Musculoskeletal:        General: Normal range of motion.      Cervical back: Neck supple.  Skin:    General: Skin is warm and dry.  Neurological:     Mental Status: He is alert and oriented to person, place, and time.  Psychiatric:        Mood and Affect: Mood and affect normal.        Behavior: Behavior normal.        Thought Content: Thought content normal.        Judgment:  Judgment normal.     Assessment/Plan: There are no preventive care reminders to display for this patient. Health Maintenance reviewed.  1. Allergic rhinitis, unspecified seasonality, unspecified trigger We are going to add flonase during allergy months.   2. Mixed hyperlipidemia Diet controlled. - TSH; Future  3. BPH associated with nocturia Normal.  4. Bilateral cold feet Circulation is good; norm cap refill. Will just eval for any thyroid/vitamin deficiencies that could worsen sx.   5. B12 deficiency - Vitamin B12; Future   Return in about 1 year (around 04/09/2021) for physical exam.  Micheline Rough, MD

## 2020-04-09 NOTE — Addendum Note (Signed)
Addended by: Tessie Fass D on: 04/09/2020 11:35 AM   Modules accepted: Orders

## 2020-04-14 NOTE — Addendum Note (Signed)
Addended by: Agnes Lawrence on: 04/14/2020 09:20 AM   Modules accepted: Orders

## 2020-05-21 ENCOUNTER — Encounter: Payer: Self-pay | Admitting: Family Medicine

## 2020-05-22 ENCOUNTER — Telehealth (INDEPENDENT_AMBULATORY_CARE_PROVIDER_SITE_OTHER): Payer: Medicare Other | Admitting: Family Medicine

## 2020-05-22 ENCOUNTER — Encounter: Payer: Self-pay | Admitting: Family Medicine

## 2020-05-22 DIAGNOSIS — U071 COVID-19: Secondary | ICD-10-CM

## 2020-05-22 MED ORDER — MOLNUPIRAVIR EUA 200MG CAPSULE
4.0000 | ORAL_CAPSULE | Freq: Two times a day (BID) | ORAL | 0 refills | Status: AC
Start: 2020-05-22 — End: 2020-05-27

## 2020-05-22 NOTE — Progress Notes (Signed)
Virtual Visit via Video Note  I connected with Jacob [name] on 05/22/20 at  3:30 PM EDT by a video enabled telemedicine application 2/2 [id number] pandemic and verified that I am speaking with the correct person using two identifiers.  Location patient: home Location provider:work or home office Persons participating in the virtual visit: patient, provider  I discussed the limitations of evaluation and management by telemedicine and the availability of in person appointments. The patient expressed understanding and agreed to proceed.   HPI: Patient is a 70 year old male with medical history significant for BPH and HLD seen for acute concern.  Patient endorses allergy symptoms times weeks including sneezing.  On Tuesday/Wednesday patient developed headache, chills, fever T-max 101.60F, and hacking cough.  Pt took and at home COVID test yesterday which was positive.  Patient has tried Tylenol, allergy medicines, and Mucinex for symptoms.  Patient denies sore throat, ear ear pain/pressure, nausea, vomiting, diarrhea, loss of taste or smell.  Pt is fully vaccinated and boosted with Pfizer COVID-vaccine.  Patient's wife tested negative.   ROS: See pertinent positives and negatives per HPI.  Past Medical History:  Diagnosis Date  . BPH associated with nocturia   . Hyperlipidemia     Past Surgical History:  Procedure Laterality Date  . APPENDECTOMY  1973  . KNEE ARTHROSCOPY Left    x2  . ROTATOR CUFF REPAIR Bilateral     Family History  Problem Relation Age of Onset  . Sarcoidosis Mother        lung  . Heart failure Father 46  . Rheum arthritis Father   . Heart attack Maternal Grandfather 60  . Other Paternal Grandfather 47  . Gout Brother   . Diabetes Neg Hx      Current Outpatient Medications:  .  Cetirizine HCl (ZYRTEC ALLERGY PO), Take by mouth daily., Disp: , Rfl:  .  Multiple Vitamins-Minerals (MULTIVITAMIN ADULTS PO), Take by mouth., Disp: , Rfl:  .  Probiotic Product  (PROBIOTIC PO), Take by mouth daily., Disp: , Rfl:   EXAM:  VITALS per patient if applicable: RR between 01-41 bpm  GENERAL: alert, oriented, appears well and in no acute distress  HEENT: atraumatic, conjunctiva clear, no obvious abnormalities on inspection of external nose and ears  NECK: normal movements of the head and neck  LUNGS: on inspection no signs of respiratory distress, breathing rate appears normal, no obvious gross SOB, gasping or wheezing  CV: no obvious cyanosis  MS: moves all visible extremities without noticeable abnormality  PSYCH/NEURO: pleasant and cooperative, no obvious depression or anxiety, speech and thought processing grossly intact  ASSESSMENT AND PLAN:  Discussed the following assessment and plan:  COVID-19 virus infection -At home test positive for COVID-19 virus yesterday -Discussed supportive care -Patient encouraged to self quarantine -[facility] answered to satisfaction -Discussed starting antiviral molnupiravir -Given precautions - Plan: molnupiravir EUA 200 mg CAPS  Follow-up as needed   I discussed the assessment and treatment plan with the patient. The patient was provided an opportunity to ask questions and all were answered. The patient agreed with the plan and demonstrated an understanding of the instructions.   The patient was advised to call back or seek an in-person evaluation if the symptoms worsen or if the condition fails to improve as anticipated.  Billie Ruddy, MD

## 2020-05-27 ENCOUNTER — Encounter: Payer: Self-pay | Admitting: Family Medicine

## 2020-06-04 ENCOUNTER — Other Ambulatory Visit: Payer: Self-pay

## 2020-06-05 ENCOUNTER — Ambulatory Visit (INDEPENDENT_AMBULATORY_CARE_PROVIDER_SITE_OTHER): Payer: Medicare Other | Admitting: Family Medicine

## 2020-06-05 ENCOUNTER — Encounter: Payer: Self-pay | Admitting: Family Medicine

## 2020-06-05 VITALS — BP 122/78 | HR 67 | Temp 97.9°F | Wt 187.2 lb

## 2020-06-05 DIAGNOSIS — H6501 Acute serous otitis media, right ear: Secondary | ICD-10-CM | POA: Diagnosis not present

## 2020-06-05 DIAGNOSIS — J302 Other seasonal allergic rhinitis: Secondary | ICD-10-CM

## 2020-06-05 DIAGNOSIS — M791 Myalgia, unspecified site: Secondary | ICD-10-CM | POA: Diagnosis not present

## 2020-06-05 MED ORDER — AMOXICILLIN 500 MG PO TABS: 500.0000 mg | ORAL_TABLET | Freq: Two times a day (BID) | ORAL | 0 refills | Status: AC

## 2020-06-05 NOTE — Patient Instructions (Signed)
Otitis Media, Adult  Otitis media occurs when there is inflammation and fluid in the middle ear space with signs and symptoms of an acute infection. The middle ear is a part of the ear that contains bones for hearing as well as air that helps send sounds to the brain. When infected fluid builds up in this space, it causes pressure and results in symptoms of acute otitis media. The eustachian tube connects the middle ear to the back of the nose (nasopharynx) and normally allows air into the middle ear space. If the eustachian tube becomes blocked, fluid can build up and become infected. What are the causes? This condition is caused by a blockage in the eustachian tube. This can be caused by an object like mucus, or by swelling (edema) of the tube. Problems that can cause a blockage include:  A cold or other upper respiratory infection.  Allergies.  An irritant, such as tobacco smoke.  Enlarged adenoids. The adenoids are areas of soft tissue located high in the back of the throat, behind the nose and the roof of the mouth. They are part of the body's defense system (immune system).  A mass in the nasopharynx.  Damage to the ear caused by pressure changes (barotrauma). What are the signs or symptoms? Symptoms of this condition include:  Ear pain.  Fever.  Decreased hearing.  Tiredness (lethargy).  Fluid leaking from the ear, if the eardrum is ruptured or has burst.  Ringing in the ear. How is this diagnosed? This condition is diagnosed with a physical exam. During the exam, your health care provider will use an instrument called an otoscope to look in your ear and check for redness, swelling, and fluid. He or she will also ask about your symptoms. Your health care provider may also order tests, such as:  A pneumatic otoscopy. This is a test to check the movement of the eardrum. It is done by squeezing a small amount of air into the ear.  A tympanogram is a test that shows how well  the eardrum moves in response to air pressure in the ear canal. It provides a graph for your health care provider to review.   How is this treated? This condition can go away on its own within 3-5 days. But if the condition is caused by a bacterial infection and does not go away on its own, or if it keeps coming back, your health care provider may:  Prescribe antibiotic medicine to treat the infection.  Prescribe or recommend medicines to control pain. Follow these instructions at home:  Take over-the-counter and prescription medicines only as told by your health care provider.  If you were prescribed an antibiotic medicine, take it as told by your health care provider. Do not stop taking the antibiotic even if you start to feel better.  Keep all follow-up visits as told by your health care provider. This is important. Contact a health care provider if:  You have bleeding from your nose.  There is a lump on your neck.  You are not feeling better in 5 days.  You feel worse instead of better. Get help right away if:  You have severe pain that is not controlled with medicine.  You have swelling, redness, or pain around your ear.  You have stiffness in your neck.  A part of your face is not moving (paralyzed).  The bone behind your ear (mastoid) is tender when you touch it.  You develop a severe headache. Summary  Otitis media is redness, soreness, and swelling of the middle ear, usually resulting in pain.  This condition can go away on its own within 3-5 days.  If the problem does not go away in 3-5 days, your health care provider may prescribe or recommend medicines to treat the infection or your symptoms.  If you were prescribed an antibiotic medicine, take it as told by your health care provider.  Follow all instructions you were given by your health care provider. This information is not intended to replace advice given to you by your health care provider. Make sure  you discuss any questions you have with your health care provider. Document Revised: 12/21/2018 Document Reviewed: 12/21/2018 Elsevier Patient Education  2021 [employer].  Muscle Pain, Adult Muscle pain, also called myalgia, is a condition in which a person has pain in one or more muscles in the body. Muscle pain may be mild, moderate, or severe. It may feel sharp, achy, or burning. In most cases, the pain lasts only a short time and goes away without treatment. Muscle pain can result from using muscles in a new or different way or after a period of inactivity. It is normal to feel some muscle pain after starting an exercise program. Muscles that have not been used often will be sore at first. What are the causes? This condition is caused by using muscles in a new or different way after a period of inactivity. Other causes may include:  Overuse or muscle strain, especially if you are not in shape. This is the most common cause of muscle pain.  Injury or bruising.  Infectious diseases, including diseases caused by viruses, such as the flu (influenza).  Fibromyalgia.This is a long-term, or chronic, condition that causes muscle tenderness, tiredness (fatigue), and headache.  Autoimmune or rheumatologic diseases. These are conditions, such as lupus, in which the body's defense system (immunesystem) attacks areas in the body.  Certain medicines, including ACE inhibitors and statins. What are the signs or symptoms? The main symptom of this condition is sore or painful muscles, including during activity and when stretching. You may also have slight swelling. How is this diagnosed? This condition is diagnosed with a physical exam. Your health care provider will ask questions about your pain and when it began. If you have not had muscle pain for very long, your health care provider may want to wait before doing much testing. If your muscle pain has lasted a long time, tests may be done right away.  In some cases, this may include tests to rule out certain conditions or illnesses. How is this treated? Treatment for this condition depends on the cause. Home care is often enough to relieve muscle pain. Your health care provider may also prescribe NSAIDs, such as ibuprofen. Follow these instructions at home: Medicines  Take over-the-counter and prescription medicines only as told by your health care provider.  Ask your health care provider if the medicine prescribed to you requires you to avoid driving or using machinery. Managing pain, swelling, and discomfort  If directed, put ice on the painful area. To do this: ? Put ice in a plastic bag. ? Place a towel between your skin and the bag. ? Leave the ice on for 20 minutes, 2-3 times a day.  For the first 2 days of muscle soreness, or if there is swelling: ? Do not soak in hot baths. ? Do not use a hot tub, steam room, sauna, heating pad, or other heat source.  After 48-72 hours, you may alternate between applying ice and applying heat as told by your health care provider. If directed, apply heat to the affected area as often as told by your health care provider. Use the heat source that your health care provider recommends, such as a moist heat pack or a heating pad. ? Place a towel between your skin and the heat source. ? Leave the heat on for 20-30 minutes. ? Remove the heat if your skin turns bright red. This is especially important if you are unable to feel pain, heat, or cold. You may have a greater risk of getting burned.  If you have an injury, raise (elevate) the injured area above the level of your heart while you are sitting or lying down.      Activity  If overuse is causing your muscle pain: ? Slow down your activities until the pain goes away. ? Do regular, gentle exercises if you are not usually active. ? Warm up before exercising. Stretch before and after exercising. This can help lower the risk of muscle pain.  Do  not continue working out if the pain is severe. Severe pain could mean that you have injured a muscle.  Do not lift anything that is heavier than 5-10 lb (2.3-4.5 kg), or the limit that you are told, until your health care provider says that it is safe.  Return to your normal activities as told by your health care provider. Ask your health care provider what activities are safe for you.   General instructions  Do not use any products that contain nicotine or tobacco, such as cigarettes, e-cigarettes, and chewing tobacco. These can delay healing. If you need help quitting, ask your health care provider.  Keep all follow-up visits as told by your health care provider. This is important. Contact a health care provider if you have:  Muscle pain that gets worse and medicines do not help.  Muscle pain that lasts longer than 3 days.  A rash or fever along with muscle pain.  Muscle pain after a tick bite.  Muscle pain while working out, even though you are in good physical condition.  Redness, soreness, or swelling along with muscle pain.  Muscle pain after starting a new medicine or changing the dose of a medicine. Get help right away if you have:  Trouble breathing.  Trouble swallowing.  Muscle pain along with a stiff neck, fever, and vomiting.  Severe muscle weakness or you cannot move part of your body. These symptoms may represent a serious problem that is an emergency. Do not wait to see if the symptoms will go away. Get medical help right away. Call your local emergency services (911 in the [place] Do not drive yourself to the hospital. Summary  Muscle pain usually lasts only a short time and goes away without treatment.  This condition is caused by using muscles in a new or different way after a period of inactivity.  If your muscle pain lasts longer than 3 days, tell your health care provider. This information is not intended to replace advice given to you by your health  care provider. Make sure you discuss any questions you have with your health care provider. Document Revised: 10/27/2018 Document Reviewed: 10/27/2018 Elsevier Patient Education  2021 [employer].

## 2020-06-05 NOTE — Progress Notes (Signed)
Subjective:    Patient ID: Jacob Bowens., male    DOB: 11-16-1950, 70 y.o.   MRN: [id number]  Chief Complaint  Patient presents with  . Ear Pain    States he was coughing on 5/02 and shortly after he noticed a pain in rt ear, like it was something in it. Did an ear wash with hydrogen peroxide and water, let soak for 5 minutes then rinsed out in shower.  . Foot Injury    More so foot tenderness, tried epsom soak, 10 minutes each foot    HPI Patient was seen today for acute concerns.  Pt dx'd with COVID-19 on 4/20, s/p antiviral molnupiravir.  Developed R ear discomfort a few days ago after coughing.  Feels clogged/ sound is muffled but not painful.  Pt notes increased allergy symptoms this yr.  Stopped taking flonase and zyrtec when became sick with COVID.  Put hydrogen peroxide in ear.  Since being sick pt with b/l foot soreness which started after he developed a fever.  Patient notes similar symptoms 2 years ago after having fever.  Patient currently soaking feet in Epson salt then in ice bath which seems to help.  Also taking 800 mg ibuprofen.    Past Medical History:  Diagnosis Date  . BPH associated with nocturia   . Hyperlipidemia     Allergies  Allergen Reactions  . Other Other (See Comments)    Pet dander, tree mold, ragweed, pollen cause sneezing and drainage    ROS General: Denies fever, chills, night sweats, changes in weight, changes in appetite HEENT: Denies headaches, ear pain, changes in vision, rhinorrhea, sore throat  + right ear discomfort CV: Denies CP, palpitations, SOB, orthopnea Pulm: Denies SOB, wheezing  + cough GI: Denies abdominal pain, nausea, vomiting, diarrhea, constipation GU: Denies dysuria, hematuria, frequency Msk: Denies muscle cramps, joint pains  + right foot tenderness Neuro: Denies weakness, numbness, tingling Skin: Denies rashes, bruising Psych: Denies depression, anxiety, hallucinations     Objective:    Blood pressure 122/78, pulse 67,  temperature 97.9 F (36.6 C), temperature source Oral, weight 187 lb 3.2 oz (84.9 kg), SpO2 99 %.  Gen. Pleasant, well-nourished, in no distress, normal affect   HEENT: [place]/AT, face symmetric, conjunctiva clear, no scleral icterus, PERRLA, EOMI, nares patent without drainage, pharynx without erythema or exudate. R TM mild erythema and serous fluid.  Left TM normal. Lungs: no accessory muscle use, CTAB, no wheezes or rales Cardiovascular: RRR, no m/r/g, no peripheral edema Musculoskeletal: No deformities of bilateral feet, no cyanosis or clubbing, normal tone Neuro:  A&Ox3, CN II-XII intact, normal gait Skin:  Warm, no lesions/ rash  Wt Readings from Last 3 Encounters:  06/05/20 187 lb 3.2 oz (84.9 kg)  04/09/20 192 lb 1.6 oz (87.1 kg)  04/08/20 191 lb (86.6 kg)    Lab Results  Component Value Date   WBC 5.4 02/26/2020   HGB 14.5 02/26/2020   HCT 43.3 02/26/2020   PLT 204.0 02/26/2020   GLUCOSE 95 02/26/2020   CHOL 202 (H) 02/26/2020   TRIG 118.0 02/26/2020   HDL 62.60 02/26/2020   LDLDIRECT 133.4 09/20/2011   LDLCALC 116 (H) 02/26/2020   ALT 16 02/26/2020   AST 15 02/26/2020   NA 139 02/26/2020   K 4.3 02/26/2020   CL 103 02/26/2020   CREATININE 1.17 02/26/2020   BUN 20 02/26/2020   CO2 32 02/26/2020   TSH 1.69 04/09/2020   PSA 2.15 02/26/2020    Assessment/Plan:  Right acute serous otitis media, recurrence not specified  -Treatment of symptoms including Tylenol as needed for pain/discomfort -Given handout - Plan: amoxicillin (AMOXIL) 500 MG tablet  Seasonal allergies -Discussed switching to a different OTC antihistamine if cetirizine no longer working -KeySpan -For additional treatment options saline nasal months and local honey  Myalgia -Unclear if associated with recent viral illness versus overuse.  Less likely 2/2 medications but was recently on molnupiravir for txt of COVID-19 virus infection. -Continue treatment of symptoms including stretching,  massage, supportive shoes, Epson salt soaks, NSAIDs/Tylenol as needed -Labs for continued or worsening symptoms -Continue to monitor  F/u with PCP as needed for continued or worsening symptoms.  Grier Mitts, MD

## 2020-06-16 ENCOUNTER — Other Ambulatory Visit: Payer: Self-pay

## 2020-06-17 ENCOUNTER — Other Ambulatory Visit (INDEPENDENT_AMBULATORY_CARE_PROVIDER_SITE_OTHER): Payer: Medicare Other

## 2020-06-17 DIAGNOSIS — E538 Deficiency of other specified B group vitamins: Secondary | ICD-10-CM

## 2020-06-17 LAB — VITAMIN B12: Vitamin B-12: 480 pg/mL (ref 211–911)

## 2020-06-18 DIAGNOSIS — J302 Other seasonal allergic rhinitis: Secondary | ICD-10-CM | POA: Insufficient documentation

## 2020-08-06 NOTE — Progress Notes (Signed)
[facility] [address] [place] Phone: [phone] Subjective:   I Jacob [name] am serving as a Education administrator for Dr. Hulan Saas.  This visit occurred during the SARS-CoV-2 public health emergency.  Safety protocols were in place, including screening questions prior to the visit, additional usage of staff PPE, and extensive cleaning of exam room while observing appropriate contact time as indicated for disinfecting solutions.   I'm seeing this patient by the request  of:  [name], MD  CC: Back and leg pain  [id number]  Tristin Vandeusen. is a 70 y.o. male coming in with complaint of back and neck pain. OMT 04/08/2020. Patient states he is doing well. States the piriformis and hamstring is about 85-95% better. Can do a 30 mile bike ride. In the mornings he states he may need to stretch more but hasn't had to take pain medication. Back in the gym and is very cautious about leg curls. Not many issues since March.   Medications patient has been prescribed: None       Reviewed prior external information including notes and imaging from previsou exam, outside providers and external EMR if available.   As well as notes that were available from care everywhere and other healthcare systems.  Past medical history, social, surgical and family history all reviewed in electronic medical record.  No pertanent information unless stated regarding to the chief complaint.   Past Medical History:  Diagnosis Date   BPH associated with nocturia    Hyperlipidemia     Allergies  Allergen Reactions   Other Other (See Comments)    Pet dander, tree mold, ragweed, pollen cause sneezing and drainage     Review of Systems:  No headache, visual changes, nausea, vomiting, diarrhea, constipation, dizziness, abdominal pain, skin rash, fevers, chills, night sweats, weight loss, swollen lymph nodes, body aches, joint swelling, chest pain, shortness of  breath, mood changes. POSITIVE muscle aches  Objective  Blood pressure 112/82, pulse 61, height 5\' 11"  (1.803 m), weight 182 lb (82.6 kg), SpO2 100 %.   General: No apparent distress alert and oriented x3 mood and affect normal, dressed appropriately.  HEENT: Pupils equal, extraocular movements intact  Respiratory: Patient's speak in full sentences and does not appear short of breath  Cardiovascular: No lower extremity edema, non tender, no erythema  Gait normal with good balance and coordination.  MSK:  Non tender with full range of motion and good stability and symmetric strength and tone of shoulders, elbows, wrist, hip, knee and ankles bilaterally.  Back -back exam does have some mild loss of lordosis.  Some mild tightness noted with FABER test on the right side compared to left.  Negative straight leg test.  Minimal pain over the piriformis.  Osteopathic findings  T7 extended rotated and side bent left L2 flexed rotated and side bent right Sacrum right on right       Assessment and Plan:  SI (sacroiliac) joint dysfunction Patient is doing significantly at this time.  Responding well to osteopathic manipulation.  Does have signs and symptoms still corresponding with some mild piriformis.  Patient also may have some more proximal hamstring tendinopathy that could be playing a role.  Discussed potentially compression sleeve with certain activities.  Follow-up with me again 2 to 3 months   Nonallopathic problems  Decision today to treat with OMT was based on Physical Exam  After verbal consent patient was treated with HVLA, ME, FPR techniques  in , thoracic, lumbar, and sacral  areas  Patient tolerated the procedure well with improvement in symptoms  Patient given exercises, stretches and lifestyle modifications  See medications in patient instructions if given  Patient will follow up in 4-8 weeks      The above documentation has been reviewed and is accurate and complete  [name], DO        Note: This dictation was prepared with Dragon dictation along with smaller phrase technology. Any transcriptional errors that result from this process are unintentional.

## 2020-08-14 ENCOUNTER — Ambulatory Visit (INDEPENDENT_AMBULATORY_CARE_PROVIDER_SITE_OTHER): Payer: Medicare Other | Admitting: Family Medicine

## 2020-08-14 ENCOUNTER — Other Ambulatory Visit: Payer: Self-pay

## 2020-08-14 ENCOUNTER — Encounter: Payer: Self-pay | Admitting: Family Medicine

## 2020-08-14 VITALS — BP 112/82 | HR 61 | Ht 71.0 in | Wt 182.0 lb

## 2020-08-14 DIAGNOSIS — M9902 Segmental and somatic dysfunction of thoracic region: Secondary | ICD-10-CM

## 2020-08-14 DIAGNOSIS — M533 Sacrococcygeal disorders, not elsewhere classified: Secondary | ICD-10-CM | POA: Diagnosis not present

## 2020-08-14 DIAGNOSIS — M9903 Segmental and somatic dysfunction of lumbar region: Secondary | ICD-10-CM | POA: Diagnosis not present

## 2020-08-14 DIAGNOSIS — M9904 Segmental and somatic dysfunction of sacral region: Secondary | ICD-10-CM | POA: Diagnosis not present

## 2020-08-14 NOTE — Patient Instructions (Addendum)
Good to see you thigh compression sleeve with golf See me again in 3-4 months

## 2020-08-14 NOTE — Assessment & Plan Note (Signed)
Patient is doing significantly at this time.  Responding well to osteopathic manipulation.  Does have signs and symptoms still corresponding with some mild piriformis.  Patient also may have some more proximal hamstring tendinopathy that could be playing a role.  Discussed potentially compression sleeve with certain activities.  Follow-up with me again 2 to 3 months

## 2020-09-17 DIAGNOSIS — L821 Other seborrheic keratosis: Secondary | ICD-10-CM | POA: Diagnosis not present

## 2020-09-17 DIAGNOSIS — D225 Melanocytic nevi of trunk: Secondary | ICD-10-CM | POA: Diagnosis not present

## 2020-09-17 DIAGNOSIS — L570 Actinic keratosis: Secondary | ICD-10-CM | POA: Diagnosis not present

## 2020-09-17 DIAGNOSIS — D692 Other nonthrombocytopenic purpura: Secondary | ICD-10-CM | POA: Diagnosis not present

## 2020-09-17 DIAGNOSIS — D1801 Hemangioma of skin and subcutaneous tissue: Secondary | ICD-10-CM | POA: Diagnosis not present

## 2020-09-17 DIAGNOSIS — L814 Other melanin hyperpigmentation: Secondary | ICD-10-CM | POA: Diagnosis not present

## 2020-09-17 DIAGNOSIS — D0472 Carcinoma in situ of skin of left lower limb, including hip: Secondary | ICD-10-CM | POA: Diagnosis not present

## 2020-09-19 ENCOUNTER — Telehealth: Payer: Self-pay | Admitting: Family Medicine

## 2020-09-19 DIAGNOSIS — M79672 Pain in left foot: Secondary | ICD-10-CM | POA: Diagnosis not present

## 2020-09-19 DIAGNOSIS — S93609A Unspecified sprain of unspecified foot, initial encounter: Secondary | ICD-10-CM | POA: Diagnosis not present

## 2020-09-19 NOTE — Telephone Encounter (Signed)
Pt seen today at [facility] for new ankle sprain. Called to ask Dr. Tamala Julian to refill meloxicam that he has used in the past for other issues.  I advised patient to contact provider that treated him today as we cannot prescribe for injuries we have not treated. Just FYI

## 2020-09-27 ENCOUNTER — Encounter: Payer: Self-pay | Admitting: Family Medicine

## 2020-10-07 NOTE — Progress Notes (Signed)
  Jacob [name] [address] [place] Phone: [phone] Subjective:   IVilma [name], am serving as a scribe for Dr. Hulan Saas. This visit occurred during the SARS-CoV-2 public health emergency.  Safety protocols were in place, including screening questions prior to the visit, additional usage of staff PPE, and extensive cleaning of exam room while observing appropriate contact time as indicated for disinfecting solutions.   I'm seeing this patient by the request  of:  [name], MD  CC: Left ankle pain  [id number]  [name]. is a 70 y.o. male coming in with complaint of back and neck pain. OMT on 08/14/2020. Patient states his back is okay today. He is having pain in his left foot and ankle. His foot has been swelling on the medial side especially after taking an urgent care prescribed meloxicam. Swelling decreased but still has pain.  Patient has had pain for some time.  Has had multiple injuries.  In 2020 was positive for last large injury.  Recently was seen in urgent care when he was told that he did have potentially 2 fractures that seem to be nonhealing.  Patient states that sometimes when he wakes up even he cannot walk on it for a couple days and then all of a sudden the pain seems to go away.  Medications patient has been prescribed: None          Past Medical History:  Diagnosis Date   BPH associated with nocturia    Hyperlipidemia     Allergies  Allergen Reactions   Other Other (See Comments)    Pet dander, tree mold, ragweed, pollen cause sneezing and drainage     Review of Systems:  No headache, visual changes, nausea, vomiting, diarrhea, constipation, dizziness, abdominal pain, skin rash, fevers, chills, night sweats, weight loss, swollen lymph nodes, body aches, joint swelling, chest pain, shortness of breath, mood changes. POSITIVE muscle aches  Objective  Blood pressure 136/82, pulse 69,  height '5\' 11"'$  (1.803 m), weight 183 lb (83 kg), SpO2 97 %.   General: No apparent distress alert and oriented x3 mood and affect normal, dressed appropriately.  HEENT: Pupils equal, extraocular movements intact  Respiratory: Patient's speak in full sentences and does not appear short of breath  Cardiovascular: No lower extremity edema, non tender, no erythema  Exam focuses on the patient's left ankle.  Some mild limited dorsiflexion and plantarflexion compared to the contralateral side.  Patient has good strength but does have some medial laxity of the lateral aspect.  Mild tenderness over the peroneal tendons.   Limited muscular skeletal ultrasound was performed and interpreted by Hulan Saas, M  Limited ultrasound shows the patient's lateral malleolus has what appears to be a chronic potential fracture noted.  In addition there seems to be potential loose body in the lateral mortise.  Do not see that ATFL.  Moderate arthritic changes.  No large effusion noted over the anterior ankle joint. Impression: Concerning ankle effusion with arthritic changes and potential for nonhealing fracture      Assessment and Plan:        The above documentation has been reviewed and is accurate and complete [name], DO        Note: This dictation was prepared with Dragon dictation along with smaller phrase technology. Any transcriptional errors that result from this process are unintentional.

## 2020-10-09 ENCOUNTER — Encounter: Payer: Self-pay | Admitting: Family Medicine

## 2020-10-09 ENCOUNTER — Ambulatory Visit (INDEPENDENT_AMBULATORY_CARE_PROVIDER_SITE_OTHER): Payer: Medicare Other

## 2020-10-09 ENCOUNTER — Ambulatory Visit (INDEPENDENT_AMBULATORY_CARE_PROVIDER_SITE_OTHER): Payer: Medicare Other | Admitting: Family Medicine

## 2020-10-09 ENCOUNTER — Ambulatory Visit: Payer: Self-pay

## 2020-10-09 ENCOUNTER — Other Ambulatory Visit: Payer: Self-pay

## 2020-10-09 VITALS — BP 136/82 | HR 69 | Ht 71.0 in | Wt 183.0 lb

## 2020-10-09 DIAGNOSIS — M25572 Pain in left ankle and joints of left foot: Secondary | ICD-10-CM | POA: Diagnosis not present

## 2020-10-09 NOTE — Patient Instructions (Signed)
Exercises at least 3x a week Voltaren and ice after activity Biking it good Hoka or Oofos recovery sandals Hoki Bondhi or Arahi shoes Keep your appointment, so we can discuss in October

## 2020-10-09 NOTE — Assessment & Plan Note (Signed)
Patient does have left ankle pain.  I am concerned the patient does have some mild instability noted.  On x-ray it does appear this may be mild gapping of the syndesmosis.  On ultrasound does appear that patient has effusion noted of the ankle mortise.  Concern for potential OCD is fairly high.  Discussed the potential advanced imaging would be warranted.  Patient at this point I would like to try conservative therapy and discussed over-the-counter shoes.  Topical anti-inflammatories, home exercises given for stability.  Avoid high impact exercises.  Follow-up with me again in 4 to 6 weeks

## 2020-10-16 DIAGNOSIS — D0472 Carcinoma in situ of skin of left lower limb, including hip: Secondary | ICD-10-CM | POA: Diagnosis not present

## 2020-10-24 ENCOUNTER — Encounter: Payer: Self-pay | Admitting: Family Medicine

## 2020-11-10 NOTE — Progress Notes (Signed)
Jacob Jacob [name] Phone: [phone] Subjective:   Jacob Jacob [name], am serving as a scribe for Dr. Hulan [name].  This visit occurred during the SARS-CoV-2 public health emergency.  Safety protocols were in place, including screening questions prior to the visit, additional usage of staff PPE, and extensive cleaning of exam room while observing appropriate contact time as indicated for disinfecting solutions.   I'm seeing this patient by the request  of:  Jacob Macadam, MD  CC: Left ankle pain follow-up  [id number]  10/09/2020 Patient does have left ankle pain.  I am concerned the patient does have some mild instability noted.  On x-ray it does appear this may be mild gapping of the syndesmosis.  On ultrasound does appear that patient has effusion noted of the ankle mortise.  Concern for potential OCD is fairly high.  Discussed the potential advanced imaging would be warranted.  Patient at this point I would like to try conservative therapy and discussed over-the-counter shoes.  Topical anti-inflammatories, home exercises given for stability.  Avoid high impact exercises.  Follow-up with me again in 4 to 6 weeks  Update 11/11/2020 Jacob Jacob [name]. is a 70 y.o. male coming in with complaint of L ankle pain. Patient states that he played golf one week ago and both ankles, knees and hips started to bother him. Went home and took a Paediatric nurse with Epsom Salts. Took some IBU and his pain has subsided. Has been riding his bike and has not had any pain. Patient is walking 2.5 miles with compression. If he is not wearing compression his pain will increase. Overall improving.       Past Medical History:  Diagnosis Date   BPH associated with nocturia    Hyperlipidemia    Past Surgical History:  Procedure Laterality Date   APPENDECTOMY  1973   KNEE ARTHROSCOPY Left    x2   ROTATOR CUFF REPAIR Bilateral    Social History    Socioeconomic History   Marital status: Married    Spouse name: Not on file   Number of children: Not on file   Years of education: Not on file   Highest education level: Not on file  Occupational History   Not on file  Tobacco Use   Smoking status: Former    Types: Cigarettes    Quit date: 1971    Years since quitting: 51.8   Smokeless tobacco: Never  Substance and Sexual Activity   Alcohol use: Yes    Alcohol/week: 7.0 standard drinks    Types: 7 Standard drinks or equivalent per week    Comment: 2 glasses wine nightly; 4 shot liquor   Drug use: Jacob [name]   Sexual activity: Not on file  Other Topics Concern   Not on file  Social History Narrative   Not on file   Social Determinants of Health   Financial Resource Strain: Low Risk    Difficulty of Paying Living Expenses: Not hard at all  Food Insecurity: Jacob [name] Food Insecurity   Worried About Charity fundraiser in the Last Year: Never true   Ran Out of Food in the Last Year: Never true  Transportation Needs: Jacob [name] Transportation Needs   Lack of Transportation (Medical): Jacob [name]   Lack of Transportation (Non-Medical): Jacob [name]  Physical Activity: Sufficiently Active   Days of Exercise per Week: 7 days   Minutes of Exercise per Session: 40 min  Stress: Jacob [name] Stress Concern  Present   Feeling of Stress : Not at all  Social Connections: Socially Integrated   Frequency of Communication with Friends and Family: More than three times a week   Frequency of Social Gatherings with Friends and Family: More than three times a week   Attends Religious Services: More than 4 times per year   Active Member of [employer] or Organizations: Yes   Attends Music therapist: More than 4 times per year   Marital Status: Married   Allergies  Allergen Reactions   Other Other (See Comments)    Pet dander, tree mold, ragweed, pollen cause sneezing and drainage   Family History  Problem Relation Age of Onset   Sarcoidosis Mother        lung   Heart  failure Father 29   Rheum arthritis Father    Heart attack Maternal Grandfather 29   Other Paternal Grandfather 96   Gout Brother    Diabetes Neg Hx       Current Outpatient Medications (Respiratory):    Cetirizine HCl (ZYRTEC ALLERGY PO), Take by mouth daily.    Current Outpatient Medications (Other):    Multiple Vitamins-Minerals (MULTIVITAMIN ADULTS PO), Take by mouth.   Probiotic Product (PROBIOTIC PO), Take by mouth daily.   Reviewed prior external information including notes and imaging from  primary care provider As well as notes that were available from care everywhere and other healthcare systems.  Past medical history, social, surgical and family history all reviewed in electronic medical record.  Jacob [name] pertanent information unless stated regarding to the chief complaint.   Review of Systems:  Jacob [name] headache, visual changes, nausea, vomiting, diarrhea, constipation, dizziness, abdominal pain, skin rash, fevers, chills, night sweats, weight loss, swollen lymph nodes, body aches, joint swelling, chest pain, shortness of breath, mood changes. POSITIVE muscle aches  Objective  Blood pressure 124/86, pulse 73, height 5\' 11"  (1.803 m), weight 184 lb (83.5 kg), SpO2 98 %.   General: Jacob [name] apparent distress alert and oriented x3 mood and affect normal, dressed appropriately.  HEENT: Pupils equal, extraocular movements intact  Respiratory: Patient's speak in full sentences and does not appear short of breath  Cardiovascular: Jacob [name] lower extremity edema, non tender, Jacob [name] erythema  Gait normal with good balance and coordination.  MSK: Left ankle exam shows the patient does have some mild impingement with full dorsiflexion.  Jacob [name] significant swelling at this time.  Minimal discomfort over the ATFL.  Good strength and stability of the ankle.  Anterior drawer.  Minimal discomfort over the peroneal tendons.  Limited muscular skeletal ultrasound was performed and interpreted by Jacob Jacob [name], M   Limited ultrasound shows the patient has had resolution of the effusion that was noted previously.  Still some narrowing of the anterior lateral mortise.  Patient does have very mild hypoechoic changes noted of the peroneal tendon just posterior to the lateral malleolus. Impression: Very mild peroneal tendinitis otherwise unremarkable    Impression and Recommendations:     The above documentation has been reviewed and is accurate and complete Jacob Pulley, DO

## 2020-11-11 ENCOUNTER — Other Ambulatory Visit: Payer: Self-pay

## 2020-11-11 ENCOUNTER — Encounter: Payer: Self-pay | Admitting: Family Medicine

## 2020-11-11 ENCOUNTER — Ambulatory Visit (INDEPENDENT_AMBULATORY_CARE_PROVIDER_SITE_OTHER): Payer: Medicare Other | Admitting: Family Medicine

## 2020-11-11 ENCOUNTER — Ambulatory Visit: Payer: Self-pay

## 2020-11-11 VITALS — BP 124/86 | HR 73 | Ht 71.0 in | Wt 184.0 lb

## 2020-11-11 DIAGNOSIS — M25572 Pain in left ankle and joints of left foot: Secondary | ICD-10-CM | POA: Diagnosis not present

## 2020-11-11 NOTE — Patient Instructions (Signed)
Great to see you Ankle looks fantastic Spenco total support orthotics for dress shoes Okay to vary footwear Keep up the tumeric See you again in 2 months just incase

## 2020-11-11 NOTE — Assessment & Plan Note (Signed)
Significant decrease in the hypoechoic changes that was seen previously.  Patient does still have some areas that does seem that could be consistent with more than impingement of the ankle.  Patient's x-rays show very minimal arthritic changes.  Discussed with patient about icing regimen and home exercises.  Increase activity slowly.  Follow-up with me again 6 to 8 weeks or as needed if doing well.

## 2020-12-11 DIAGNOSIS — H40013 Open angle with borderline findings, low risk, bilateral: Secondary | ICD-10-CM | POA: Diagnosis not present

## 2020-12-11 DIAGNOSIS — H2513 Age-related nuclear cataract, bilateral: Secondary | ICD-10-CM | POA: Diagnosis not present

## 2020-12-11 DIAGNOSIS — H35373 Puckering of macula, bilateral: Secondary | ICD-10-CM | POA: Diagnosis not present

## 2020-12-11 DIAGNOSIS — H40033 Anatomical narrow angle, bilateral: Secondary | ICD-10-CM | POA: Diagnosis not present

## 2020-12-22 DIAGNOSIS — M25511 Pain in right shoulder: Secondary | ICD-10-CM | POA: Diagnosis not present

## 2020-12-30 DIAGNOSIS — M25511 Pain in right shoulder: Secondary | ICD-10-CM | POA: Diagnosis not present

## 2021-01-05 ENCOUNTER — Encounter: Payer: Self-pay | Admitting: Family Medicine

## 2021-01-07 DIAGNOSIS — M25511 Pain in right shoulder: Secondary | ICD-10-CM | POA: Diagnosis not present

## 2021-01-07 NOTE — Progress Notes (Signed)
Zach [facility] Heideman C[facility] [address] [place] Phone: [phone] Subjective:   IVilma Meckel, am serving as a scribe for Dr. Hulan Saas. This visit occurred during the SARS-CoV-2 public health emergency.  Safety protocols were in place, including screening questions prior to the visit, additional usage of staff PPE, and extensive cleaning of exam room while observing appropriate contact time as indicated for disinfecting solutions.   I'm seeing this patient by the request  of:  [name], MD  CC: Left ankle pain, back pain follow-up  IEP:[id number]  11/11/2020 Significant decrease in the hypoechoic changes that was seen previously.  Patient does still have some areas that does seem that could be consistent with more than impingement of the ankle.  Patient's x-rays show very minimal arthritic changes.  Discussed with patient about icing regimen and home exercises.  Increase activity slowly.  Follow-up with me again 6 to 8 weeks or as needed if doing well.''  Update 01/13/2021 Candise Bowens. is a 70 y.o. male coming in with complaint of L ankle pain. Patient states ankles are doing well. Walking 4 miles tired afterwards, but feeling good overall. Here for OMT. Back and neck is seems to be doing relatively well.  Mild tightness noted.  Other than that has been able to stay fairly active.  Still has not been playing as much golf as he would like      Past Medical History:  Diagnosis Date   BPH associated with nocturia    Hyperlipidemia    Past Surgical History:  Procedure Laterality Date   APPENDECTOMY  1973   KNEE ARTHROSCOPY Left    x2   ROTATOR CUFF REPAIR Bilateral    Social History   Socioeconomic History   Marital status: Married    Spouse name: Not on file   Number of children: Not on file   Years of education: Not on file   Highest education level: Not on file  Occupational History   Not on file  Tobacco Use    Smoking status: Former    Types: Cigarettes    Quit date: 1971    Years since quitting: 51.9   Smokeless tobacco: Never  Substance and Sexual Activity   Alcohol use: Yes    Alcohol/week: 7.0 standard drinks    Types: 7 Standard drinks or equivalent per week    Comment: 2 glasses wine nightly; 4 shot liquor   Drug use: No   Sexual activity: Not on file  Other Topics Concern   Not on file  Social History Narrative   Not on file   Social Determinants of Health   Financial Resource Strain: Low Risk    Difficulty of Paying Living Expenses: Not hard at all  Food Insecurity: No Food Insecurity   Worried About Charity fundraiser in the Last Year: Never true   Ran Out of Food in the Last Year: Never true  Transportation Needs: No Transportation Needs   Lack of Transportation (Medical): No   Lack of Transportation (Non-Medical): No  Physical Activity: Sufficiently Active   Days of Exercise per Week: 7 days   Minutes of Exercise per Session: 30 min  Stress: Not on file  Social Connections: Socially Integrated   Frequency of Communication with Friends and Family: More than three times a week   Frequency of Social Gatherings with Friends and Family: More than three times a week   Attends Religious Services: More than 4 times  per year   Active Member of Clubs or Organizations: Yes   Attends Archivist Meetings: More than 4 times per year   Marital Status: Married   Allergies  Allergen Reactions   Other Other (See Comments)    Pet dander, tree mold, ragweed, pollen cause sneezing and drainage   Family History  Problem Relation Age of Onset   Sarcoidosis Mother        lung   Heart failure Father 45   Rheum arthritis Father    Heart attack Maternal Grandfather 91   Other Paternal Grandfather 96   Gout Brother    Diabetes Neg Hx       Current Outpatient Medications (Respiratory):    Cetirizine HCl (ZYRTEC ALLERGY PO), Take by mouth daily.    Current Outpatient  Medications (Other):    Multiple Vitamins-Minerals (MULTIVITAMIN ADULTS PO), Take by mouth.   Probiotic Product (PROBIOTIC PO), Take by mouth daily.   Reviewed prior external information including notes and imaging from  primary care provider As well as notes that were available from care everywhere and other healthcare systems.  Past medical history, social, surgical and family history all reviewed in electronic medical record.  No pertanent information unless stated regarding to the chief complaint.   Review of Systems:  No headache, visual changes, nausea, vomiting, diarrhea, constipation, dizziness, abdominal pain, skin rash, fevers, chills, night sweats, weight loss, swollen lymph nodes, body aches, joint swelling, chest pain, shortness of breath, mood changes. POSITIVE muscle aches  Objective  Blood pressure 112/78, height 5\' 11"  (1.803 m), weight 174 lb (78.9 kg).   General: No apparent distress alert and oriented x3 mood and affect normal, dressed appropriately.  HEENT: Pupils equal, extraocular movements intact  Respiratory: Patient's speak in full sentences and does not appear short of breath  Cardiovascular: No lower extremity edema, non tender, no erythema  Gait normal with good balance and coordination.  MSK: Back exam does have some mild loss of lordosis.  Tightness noted around the right sacroiliac joint.  Patient does have mild positive Corky Sox.  Negative straight leg test.  Osteopathic findings T4 extended rotated and side bent left L1 flexed rotated and side bent right Sacrum right on right     Impression and Recommendations:    The above documentation has been reviewed and is accurate and complete [name], DO

## 2021-01-08 ENCOUNTER — Ambulatory Visit (INDEPENDENT_AMBULATORY_CARE_PROVIDER_SITE_OTHER): Payer: Medicare Other

## 2021-01-08 VITALS — Ht 71.0 in | Wt 184.0 lb

## 2021-01-08 DIAGNOSIS — Z0000 Encounter for general adult medical examination without abnormal findings: Secondary | ICD-10-CM

## 2021-01-08 NOTE — Progress Notes (Addendum)
Subjective:   Mj Willis. is a 70 y.o. male who presents for Medicare Annual/Subsequent preventive examination.  Review of Systems    No ROS      Objective:    Today's Vitals   01/08/21 1338  Weight: 184 lb (83.5 kg)  Height: 5\' 11"  (1.803 m)   Body mass index is 25.66 kg/m.  Advanced Directives 01/08/2021 01/07/2020  Does Patient Have a Medical Advance Directive? Yes Yes  Type of Paramedic of [place];Living will [place];Living will  Does patient want to make changes to medical advance directive? No - Patient declined No - Patient declined  Copy of [place] in Chart? No - copy requested No - copy requested    Current Medications (verified) Outpatient Encounter Medications as of 01/08/2021  Medication Sig   Cetirizine HCl (ZYRTEC ALLERGY PO) Take by mouth daily.   Multiple Vitamins-Minerals (MULTIVITAMIN ADULTS PO) Take by mouth.   Probiotic Product (PROBIOTIC PO) Take by mouth daily.   No facility-administered encounter medications on file as of 01/08/2021.    Allergies (verified) Other   History: Past Medical History:  Diagnosis Date   BPH associated with nocturia    Hyperlipidemia    Past Surgical History:  Procedure Laterality Date   APPENDECTOMY  1973   KNEE ARTHROSCOPY Left    x2   ROTATOR CUFF REPAIR Bilateral    Family History  Problem Relation Age of Onset   Sarcoidosis Mother        lung   Heart failure Father 85   Rheum arthritis Father    Heart attack Maternal Grandfather 36   Other Paternal Grandfather 96   Gout Brother    Diabetes Neg Hx    Social History   Socioeconomic History   Marital status: Married    Spouse name: Not on file   Number of children: Not on file   Years of education: Not on file   Highest education level: Not on file  Occupational History   Not on file  Tobacco Use   Smoking status: Former    Types: Cigarettes    Quit date: 1971    Years  since quitting: 51.9   Smokeless tobacco: Never  Substance and Sexual Activity   Alcohol use: Yes    Alcohol/week: 7.0 standard drinks    Types: 7 Standard drinks or equivalent per week    Comment: 2 glasses wine nightly; 4 shot liquor   Drug use: No   Sexual activity: Not on file  Other Topics Concern   Not on file  Social History Narrative   Not on file   Social Determinants of Health   Financial Resource Strain: Low Risk    Difficulty of Paying Living Expenses: Not hard at all  Food Insecurity: No Food Insecurity   Worried About Charity fundraiser in the Last Year: Never true   Ran Out of Food in the Last Year: Never true  Transportation Needs: No Transportation Needs   Lack of Transportation (Medical): No   Lack of Transportation (Non-Medical): No  Physical Activity: Sufficiently Active   Days of Exercise per Week: 7 days   Minutes of Exercise per Session: 30 min  Stress: Not on file  Social Connections: Socially Integrated   Frequency of Communication with Friends and Family: More than three times a week   Frequency of Social Gatherings with Friends and Family: More than three times a week   Attends Religious Services:  More than 4 times per year   Active Member of Clubs or Organizations: Yes   Attends Archivist Meetings: More than 4 times per year   Marital Status: Married    Clinical Intake:  How often do you need to have someone help you when you read instructions, pamphlets, or other written materials from your doctor or pharmacy?: 1 - Never  Diabetic? No  Activities of Daily Living In your present state of health, do you have any difficulty performing the following activities: 01/08/2021 01/08/2021  Hearing? N N  Vision? N N  Comment wears reading glasses -  Difficulty concentrating or making decisions? N N  Walking or climbing stairs? N N  Dressing or bathing? N N  Doing errands, shopping? N N  Preparing Food and eating ? N N  Using the Toilet?  N N  In the past six months, have you accidently leaked urine? N N  Do you have problems with loss of bowel control? N N  Managing your Medications? N N  Managing your Finances? N N  Housekeeping or managing your Housekeeping? N N  Some recent data might be hidden    Patient Care Team: [name], MD as PCP - General (Family Medicine) [name], MD as Consulting Physician (Dermatology) Monna Fam, MD as Consulting Physician (Ophthalmology)  Indicate any recent Medical Services you may have received from other than [facility] providers in the past year (date may be approximate).     Assessment:   This is a routine wellness examination for Delrae Caesar.  Virtual Visit via Telephone Note  I connected with  Candise Bowens. on 01/08/21 at  1:45 PM EST by telephone and verified that I am speaking with the correct person using two identifiers.  Location: Patient: Home  Provider: Office Persons participating in the virtual visit: patient/Nurse Health Advisor   I discussed the limitations, risks, security and privacy concerns of performing an evaluation and management service by telephone and the availability of in person appointments. The patient expressed understanding and agreed to proceed.  Interactive audio and video telecommunications were attempted between this nurse and patient, however failed, due to patient having technical difficulties OR patient did not have access to video capability.  We continued and completed visit with audio only.  Some vital signs may be absent or patient reported.   Criselda Peaches, LPN   Hearing/Vision screen Hearing Screening - Comments:: No difficulty hearing Vision Screening - Comments:: Wears reading glasses. Followed by Dr Herbert Deaner  Dietary issues and exercise activities discussed: Current Exercise Habits: Home exercise routine, Type of exercise: walking, Time (Minutes): 30, Frequency (Times/Week): 7, Weekly Exercise (Minutes/Week): 210,  Intensity: Moderate   Goals Addressed             This Visit's Progress    Patient Stated       I would like to continue exercising daily.       Depression Screen PHQ 2/9 Scores 01/08/2021 01/07/2020 12/06/2018 12/09/2017 11/30/2016 11/06/2015 11/05/2014  PHQ - 2 Score 0 0 0 0 0 0 0  PHQ- 9 Score - 0 - - - - -    Fall Risk Fall Risk  01/08/2021 01/08/2021 01/05/2021 01/07/2020 12/22/2018  Falls in the past year? - 0 0 0 0  Comment - - - - Emmi Telephone Survey: data to providers prior to load  Number falls in past yr: 0 0 0 0 -  Injury with Fall? 0 0 0 0 -  Risk  for fall due to : - - - No Fall Risks -  Follow up - - - Falls evaluation completed;Falls prevention discussed -    FALL RISK PREVENTION PERTAINING TO THE HOME:  Any stairs in or around the home? Yes  If so, are there any without handrails? No  Home free of loose throw rugs in walkways, pet beds, electrical cords, etc? Yes  Adequate lighting in your home to reduce risk of falls? Yes   ASSISTIVE DEVICES UTILIZED TO PREVENT FALLS:  Life alert? No  Use of a cane, walker or w/c? No  Grab bars in the bathroom? No  Shower chair or bench in shower? Yes  Elevated toilet seat or a handicapped toilet? No   TIMED UP AND GO:  Was the test performed? No . Audio Visit  Cognitive Function:    Immunizations Immunization History  Administered Date(s) Administered   Fluad Quad(high Dose 65+) 10/24/2018, 01/07/2020   Influenza Split 10/12/2011   Influenza Whole 11/01/2008   Influenza, High Dose Seasonal PF 11/05/2014, 12/02/2015, 11/30/2016, 12/09/2017   Influenza,inj,Quad PF,6+ Mos 02/12/2013   Influenza-Unspecified 12/09/2017   PFIZER(Purple Top)SARS-COV-2 Vaccination 03/09/2019, 04/03/2019, 11/05/2019   Pneumococcal Conjugate-13 11/30/2016   Pneumococcal Polysaccharide-23 12/09/2017   Td 02/01/2002   Tdap 04/17/2013    Flu Vaccine status: Due, Education has been provided regarding the importance of this vaccine.  Advised may receive this vaccine at local pharmacy or Health Dept. Aware to provide a copy of the vaccination record if obtained from local pharmacy or Health Dept. Verbalized acceptance and understanding.   Covid-19 vaccine status: Information provided on how to obtain vaccines.   Qualifies for Shingles Vaccine? Yes   Zostavax completed No   Shingrix Completed?: No.    Education has been provided regarding the importance of this vaccine. Patient has been advised to call insurance company to determine out of pocket expense if they have not yet received this vaccine. Advised may also receive vaccine at local pharmacy or Health Dept. Verbalized acceptance and understanding.  Screening Tests Health Maintenance  Topic Date Due   COVID-19 Vaccine (4 - Booster for Pfizer series) 12/31/2019   Zoster Vaccines- Shingrix (1 of 2) 04/08/2021 (Originally 09/09/1998)   INFLUENZA VACCINE  05/01/2021 (Originally 09/01/2020)   COLONOSCOPY (Pts 45-28yrs Insurance coverage will need to be confirmed)  02/27/2022 (Originally 02/01/2022)   TETANUS/TDAP  04/18/2023   Pneumonia Vaccine 77+ Years old  Completed   Hepatitis C Screening  Completed   HPV VACCINES  Aged Out    Health Maintenance  Health Maintenance Due  Topic Date Due   COVID-19 Vaccine (4 - Booster for [place] series) 12/31/2019    Additional Screening:  Vision Screening: Recommended annual ophthalmology exams for early detection of glaucoma and other disorders of the eye. Is the patient up to date with their annual eye exam?  Yes  Who is the provider or what is the name of the office in which the patient attends annual eye exams? Followed by Dr Herbert Deaner.   Dental Screening: Recommended annual dental exams for proper oral hygiene  Community Resource Referral / Chronic Care Management:   CRR required this visit?  No   CCM required this visit?  No      Plan:     I have personally reviewed and noted the following in the patient's chart:    Medical and social history Use of alcohol, tobacco or illicit drugs  Current medications and supplements including opioid prescriptions. Patient is not currently taking opioid  prescriptions. Functional ability and status Nutritional status Physical activity Advanced directives List of other physicians Hospitalizations, surgeries, and ER visits in previous 12 months Vitals Screenings to include cognitive, depression, and falls Referrals and appointments  In addition, I have reviewed and discussed with patient certain preventive protocols, quality metrics, and best practice recommendations. A written personalized care plan for preventive services as well as general preventive health recommendations were provided to patient.     Criselda Peaches, LPN   45/07/2561

## 2021-01-08 NOTE — Patient Instructions (Addendum)
Jacob [name] , Thank you for taking time to come for your Medicare Wellness Visit. I appreciate your ongoing commitment to your health goals. Please review the following plan we discussed and let me know if I can assist you in the future.   These are the goals we discussed:  Goals      Patient Stated     I would like to continue exercising daily.     Weight (lb) < 175 lb (79.4 kg)        This is a list of the screening recommended for you and due dates:  Health Maintenance  Topic Date Due   COVID-19 Vaccine (4 - Booster for Pfizer series) 12/31/2019   Zoster (Shingles) Vaccine (1 of 2) 04/08/2021*   Flu Shot  05/01/2021*   Colon Cancer Screening  02/27/2022*   Tetanus Vaccine  04/18/2023   Pneumonia Vaccine  Completed   Hepatitis C Screening: USPSTF Recommendation to screen - Ages 18-79 yo.  Completed   HPV Vaccine  Aged Out  *Topic was postponed. The date shown is not the original due date.   Advanced directives: Yes  Conditions/risks identified: None  Next appointment: Follow up in one year for your annual wellness visit.   Preventive Care 42 Years and Older, Male Preventive care refers to lifestyle choices and visits with your health care provider that can promote health and wellness. What does preventive care include? A yearly physical exam. This is also called an annual well check. Dental exams once or twice a year. Routine eye exams. Ask your health care provider how often you should have your eyes checked. Personal lifestyle choices, including: Daily care of your teeth and gums. Regular physical activity. Eating a healthy diet. Avoiding tobacco and drug use. Limiting alcohol use. Practicing safe sex. Taking low doses of aspirin every day. Taking vitamin and mineral supplements as recommended by your health care provider. What happens during an annual well check? The services and screenings done by your health care provider during your annual well check will  depend on your age, overall health, lifestyle risk factors, and family history of disease. Counseling  Your health care provider may ask you questions about your: Alcohol use. Tobacco use. Drug use. Emotional well-being. Home and relationship well-being. Sexual activity. Eating habits. History of falls. Memory and ability to understand (cognition). Work and work Statistician. Screening  You may have the following tests or measurements: Height, weight, and BMI. Blood pressure. Lipid and cholesterol levels. These may be checked every 5 years, or more frequently if you are over 94 years old. Skin check. Lung cancer screening. You may have this screening every year starting at age 72 if you have a 30-pack-year history of smoking and currently smoke or have quit within the past 15 years. Fecal occult blood test (FOBT) of the stool. You may have this test every year starting at age 72. Flexible sigmoidoscopy or colonoscopy. You may have a sigmoidoscopy every 5 years or a colonoscopy every 10 years starting at age 72. Prostate cancer screening. Recommendations will vary depending on your family history and other risks. Hepatitis C blood test. Hepatitis B blood test. Sexually transmitted disease (STD) testing. Diabetes screening. This is done by checking your blood sugar (glucose) after you have not eaten for a while (fasting). You may have this done every 1-3 years. Abdominal aortic aneurysm (AAA) screening. You may need this if you are a current or former smoker. Osteoporosis. You may be screened starting at age 72  if you are at high risk. Talk with your health care provider about your test results, treatment options, and if necessary, the need for more tests. Vaccines  Your health care provider may recommend certain vaccines, such as: Influenza vaccine. This is recommended every year. Tetanus, diphtheria, and acellular pertussis (Tdap, Td) vaccine. You may need a Td booster every 10  years. Zoster vaccine. You may need this after age 72. Pneumococcal 13-valent conjugate (PCV13) vaccine. One dose is recommended after age 20. Pneumococcal polysaccharide (PPSV23) vaccine. One dose is recommended after age 72. Talk to your health care provider about which screenings and vaccines you need and how often you need them. This information is not intended to replace advice given to you by your health care provider. Make sure you discuss any questions you have with your health care provider. Document Released: 02/14/2015 Document Revised: 10/08/2015 Document Reviewed: 11/19/2014 Elsevier Interactive Patient Education  2017 [place] Prevention in the Home Falls can cause injuries. They can happen to people of all ages. There are many things you can do to make your home safe and to help prevent falls. What can I do on the outside of my home? Regularly fix the edges of walkways and driveways and fix any cracks. Remove anything that might make you trip as you walk through a door, such as a raised step or threshold. Trim any bushes or trees on the path to your home. Use bright outdoor lighting. Clear any walking paths of anything that might make someone trip, such as rocks or tools. Regularly check to see if handrails are loose or broken. Make sure that both sides of any steps have handrails. Any raised decks and porches should have guardrails on the edges. Have any leaves, snow, or ice cleared regularly. Use sand or salt on walking paths during winter. Clean up any spills in your garage right away. This includes oil or grease spills. What can I do in the bathroom? Use night lights. Install grab bars by the toilet and in the tub and shower. Do not use towel bars as grab bars. Use non-skid mats or decals in the tub or shower. If you need to sit down in the shower, use a plastic, non-slip stool. Keep the floor dry. Clean up any water that spills on the floor as soon as it  happens. Remove soap buildup in the tub or shower regularly. Attach bath mats securely with double-sided non-slip rug tape. Do not have throw rugs and other things on the floor that can make you trip. What can I do in the bedroom? Use night lights. Make sure that you have a light by your bed that is easy to reach. Do not use any sheets or blankets that are too big for your bed. They should not hang down onto the floor. Have a firm chair that has side arms. You can use this for support while you get dressed. Do not have throw rugs and other things on the floor that can make you trip. What can I do in the kitchen? Clean up any spills right away. Avoid walking on wet floors. Keep items that you use a lot in easy-to-reach places. If you need to reach something above you, use a strong step stool that has a grab bar. Keep electrical cords out of the way. Do not use floor polish or wax that makes floors slippery. If you must use wax, use non-skid floor wax. Do not have throw rugs and other things  on the floor that can make you trip. What can I do with my stairs? Do not leave any items on the stairs. Make sure that there are handrails on both sides of the stairs and use them. Fix handrails that are broken or loose. Make sure that handrails are as long as the stairways. Check any carpeting to make sure that it is firmly attached to the stairs. Fix any carpet that is loose or worn. Avoid having throw rugs at the top or bottom of the stairs. If you do have throw rugs, attach them to the floor with carpet tape. Make sure that you have a light switch at the top of the stairs and the bottom of the stairs. If you do not have them, ask someone to add them for you. What else can I do to help prevent falls? Wear shoes that: Do not have high heels. Have rubber bottoms. Are comfortable and fit you well. Are closed at the toe. Do not wear sandals. If you use a stepladder: Make sure that it is fully opened.  Do not climb a closed stepladder. Make sure that both sides of the stepladder are locked into place. Ask someone to hold it for you, if possible. Clearly mark and make sure that you can see: Any grab bars or handrails. First and last steps. Where the edge of each step is. Use tools that help you move around (mobility aids) if they are needed. These include: Canes. Walkers. Scooters. Crutches. Turn on the lights when you go into a dark area. Replace any light bulbs as soon as they burn out. Set up your furniture so you have a clear path. Avoid moving your furniture around. If any of your floors are uneven, fix them. If there are any pets around you, be aware of where they are. Review your medicines with your doctor. Some medicines can make you feel dizzy. This can increase your chance of falling. Ask your doctor what other things that you can do to help prevent falls. This information is not intended to replace advice given to you by your health care provider. Make sure you discuss any questions you have with your health care provider. Document Released: 11/14/2008 Document Revised: 06/26/2015 Document Reviewed: 02/22/2014 Elsevier Interactive Patient Education  2017 [employer].

## 2021-01-13 ENCOUNTER — Ambulatory Visit (INDEPENDENT_AMBULATORY_CARE_PROVIDER_SITE_OTHER): Payer: Medicare Other | Admitting: Family Medicine

## 2021-01-13 ENCOUNTER — Other Ambulatory Visit: Payer: Self-pay

## 2021-01-13 VITALS — BP 112/78 | Ht 71.0 in | Wt 174.0 lb

## 2021-01-13 DIAGNOSIS — M533 Sacrococcygeal disorders, not elsewhere classified: Secondary | ICD-10-CM | POA: Diagnosis not present

## 2021-01-13 DIAGNOSIS — M9904 Segmental and somatic dysfunction of sacral region: Secondary | ICD-10-CM | POA: Diagnosis not present

## 2021-01-13 DIAGNOSIS — M999 Biomechanical lesion, unspecified: Secondary | ICD-10-CM | POA: Diagnosis not present

## 2021-01-13 DIAGNOSIS — M9902 Segmental and somatic dysfunction of thoracic region: Secondary | ICD-10-CM

## 2021-01-13 DIAGNOSIS — M9903 Segmental and somatic dysfunction of lumbar region: Secondary | ICD-10-CM | POA: Diagnosis not present

## 2021-01-13 NOTE — Patient Instructions (Signed)
Great to see you  Jacob [name] is your friend Have a great holiday  Stay active  See me again in 6 weeks

## 2021-01-13 NOTE — Assessment & Plan Note (Signed)
Chronic, with mild exacerbation.  Still responding extremely well to osteopathic manipulation.  Discussed posture and ergonomics, which activities to do which wants to avoid.  Increase activity slowly.  Discussed with patient about icing regimen and home exercises.  Patient wants to avoid any medications at the moment.  Discussed using them for intermittent pain.  Follow-up again in 6 to 8 weeks

## 2021-01-13 NOTE — Assessment & Plan Note (Signed)

## 2021-03-02 NOTE — Progress Notes (Signed)
°  Jacob [name] [address] [place] Jacob [name] Phone: [phone] Subjective:   Jacob [name], am serving as a scribe for Dr. Hulan Saas. This visit occurred during the SARS-CoV-2 public health emergency.  Safety protocols were in place, including screening questions prior to the visit, additional usage of staff PPE, and extensive cleaning of exam room while observing appropriate contact time as indicated for disinfecting solutions.  9 I'm seeing this patient by the request  of:  Koberlein, Steele Berg, MD  CC: Back and neck pain follow-up  [id number]  Jacob [name]. is a 71 y.o. male coming in with complaint of back and neck pain. OMT 01/13/2021. Patient states same per usual. No new complaints.  Medications patient has been prescribed: None  Taking:      Reviewed prior external information including notes and imaging from previsou exam, outside providers and external EMR if available.   As well as notes that were available from care everywhere and other healthcare systems.  Past medical history, social, surgical and family history all reviewed in electronic medical record.  No pertanent information unless stated regarding to the chief complaint.   Past Medical History:  Diagnosis Date   BPH associated with nocturia    Hyperlipidemia     Allergies  Allergen Reactions   Other Other (See Comments)    Pet dander, tree mold, ragweed, pollen cause sneezing and drainage      Objective  Blood pressure 122/82, pulse 71, height 5\' 11"  (1.803 m), weight 177 lb (80.3 kg), SpO2 99 %.   General: No apparent distress alert and oriented x3 mood and affect normal, dressed appropriately.  HEENT: Pupils equal, extraocular movements intact  Respiratory: Patient's speak in full sentences and does not appear short of breath  Cardiovascular: No lower extremity edema, non tender, no erythema  Back exam does have some loss of lordosis.  Patient does  have tenderness over the right sacroiliac joint.  Patient does have 4-5 strength of the hip abductors bilaterally and symmetric.  Mild tightness with FABER test.  Osteopathic findings  T9 extended rotated and side bent left L2 flexed rotated and side bent right Sacrum right on right       Assessment and Plan: SI (sacroiliac) joint dysfunction Chronic problem and stable.  Discussed the importance of hip abductor strengthening.  Patient has noticed that patient does get some fatigue in the gluteal area.  Discussed home exercises and icing regimen.  Responding well to osteopathic manipulation.  Did give her some suggestions with hip abductor strengthening.  Follow-up with me again in 4 to 8 weeks.    Nonallopathic problems  Decision today to treat with OMT was based on Physical Exam  After verbal consent patient was treated with HVLA, ME, FPR techniques in  thoracic, lumbar, and sacral  areas  Patient tolerated the procedure well with improvement in symptoms  Patient given exercises, stretches and lifestyle modifications  See medications in patient instructions if given  Patient will follow up in 4-8 weeks     The above documentation has been reviewed and is accurate and complete Jacob Pulley, DO        Note: This dictation was prepared with Dragon dictation along with smaller phrase technology. Any transcriptional errors that result from this process are unintentional.

## 2021-03-03 ENCOUNTER — Other Ambulatory Visit: Payer: Self-pay

## 2021-03-03 ENCOUNTER — Ambulatory Visit (INDEPENDENT_AMBULATORY_CARE_PROVIDER_SITE_OTHER): Payer: Medicare Other | Admitting: Family Medicine

## 2021-03-03 ENCOUNTER — Encounter: Payer: Self-pay | Admitting: Family Medicine

## 2021-03-03 VITALS — BP 122/82 | HR 71 | Ht 71.0 in | Wt 177.0 lb

## 2021-03-03 DIAGNOSIS — M9902 Segmental and somatic dysfunction of thoracic region: Secondary | ICD-10-CM | POA: Diagnosis not present

## 2021-03-03 DIAGNOSIS — M9903 Segmental and somatic dysfunction of lumbar region: Secondary | ICD-10-CM | POA: Diagnosis not present

## 2021-03-03 DIAGNOSIS — M533 Sacrococcygeal disorders, not elsewhere classified: Secondary | ICD-10-CM

## 2021-03-03 DIAGNOSIS — M9904 Segmental and somatic dysfunction of sacral region: Secondary | ICD-10-CM | POA: Diagnosis not present

## 2021-03-03 MED ORDER — MELOXICAM 15 MG PO TABS: 15.0000 mg | ORAL_TABLET | Freq: Every day | ORAL | 0 refills | Status: DC

## 2021-03-03 NOTE — Assessment & Plan Note (Signed)
Chronic problem and stable.  Discussed the importance of hip abductor strengthening.  Patient has noticed that patient does get some fatigue in the gluteal area.  Discussed home exercises and icing regimen.  Responding well to osteopathic manipulation.  Did give her some suggestions with hip abductor strengthening.  Follow-up with me again in 4 to 8 weeks.

## 2021-03-03 NOTE — Patient Instructions (Signed)
Have fun in [place] Stay Hydrated See you again in 6 weeks

## 2021-03-30 ENCOUNTER — Other Ambulatory Visit: Payer: Self-pay | Admitting: Family Medicine

## 2021-04-10 NOTE — Progress Notes (Signed)
[name] [address] [place] [address] Phone: [phone] Subjective:   Jacob [name], am serving as a scribe for Dr. Antoine Primas.  This visit occurred during the SARS-CoV-2 public health emergency.  Safety protocols were in place, including screening questions prior to the visit, additional usage of staff PPE, and extensive cleaning of exam room while observing appropriate contact time as indicated for disinfecting solutions.    I'm seeing this patient by the request  of:  Koberlein, Paris Lore, MD  CC: Back and neck pain follow-up  [id number]  Gleason Berber. is a 71 y.o. male coming in with complaint of back and neck pain. OMT 03/03/2021.  Patient states that his back is doing well despite traveling a lot in the car recently.  Patient states overall did relatively well.  He did have a bike trip in [place] and did well with this as well.  Patient has 2 other large trips coming up and wants to continue to have the manipulation because it is helpful.  Has not had to take the meloxicam.  Medications patient has been prescribed: Meloxicam prn  Taking:         Reviewed prior external information including notes and imaging from previsou exam, outside providers and external EMR if available.   As well as notes that were available from care everywhere and other healthcare systems.  Past medical history, social, surgical and family history all reviewed in electronic medical record.  No pertanent information unless stated regarding to the chief complaint.   Past Medical History:  Diagnosis Date   BPH associated with nocturia    Hyperlipidemia     Allergies  Allergen Reactions   Other Other (See Comments)    Pet dander, tree mold, ragweed, pollen cause sneezing and drainage     Review of Systems:  No headache, visual changes, nausea, vomiting, diarrhea, constipation, dizziness, abdominal pain, skin rash, fevers, chills, night  sweats, weight loss, swollen lymph nodes, body aches, joint swelling, chest pain, shortness of breath, mood changes. POSITIVE muscle aches  Objective  Blood pressure 118/82, pulse 66, height 5' 10.5" (1.791 m), weight 182 lb (82.6 kg), SpO2 99 %.   General: No apparent distress alert and oriented x3 mood and affect normal, dressed appropriately.  HEENT: Pupils equal, extraocular movements intact  Respiratory: Patient's speak in full sentences and does not appear short of breath  Cardiovascular: No lower extremity edema, non tender, no erythema  Neck exam does have some mild loss of lordosis.  Tightness more on the left side of the neck on the right.  Patient has some mild tightness noted over the left upper extremity. Low back does have some tenderness to palpation  Osteopathic findings  C5 flexed rotated and side bent left T9 extended rotated and side bent left inhaled rib L2 flexed rotated and side bent right Sacrum right on right       Assessment and Plan:  SI (sacroiliac) joint dysfunction Discussed HEP   Discussed which activities to do discussed with patient icing regimen and home exercises.  Patient has done relatively well but did have some more tightness of the neck noted today.  Patient will come back again in 6 to 8 weeks   Nonallopathic problems  Decision today to treat with OMT was based on Physical Exam  After verbal consent patient was treated with HVLA, ME, FPR techniques in cervical, rib, thoracic, lumbar, and sacral  areas  Patient tolerated the procedure  well with improvement in symptoms  Patient given exercises, stretches and lifestyle modifications  See medications in patient instructions if given  Patient will follow up in 4-8 weeks      The above documentation has been reviewed and is accurate and complete Judi Saa, DO        Note: This dictation was prepared with Dragon dictation along with smaller phrase technology. Any  transcriptional errors that result from this process are unintentional.

## 2021-04-13 ENCOUNTER — Encounter: Payer: Self-pay | Admitting: Family Medicine

## 2021-04-13 ENCOUNTER — Ambulatory Visit (INDEPENDENT_AMBULATORY_CARE_PROVIDER_SITE_OTHER): Payer: Medicare Other | Admitting: Family Medicine

## 2021-04-13 VITALS — BP 120/80 | HR 60 | Temp 97.7°F | Ht 70.5 in | Wt 184.5 lb

## 2021-04-13 DIAGNOSIS — E782 Mixed hyperlipidemia: Secondary | ICD-10-CM | POA: Diagnosis not present

## 2021-04-13 DIAGNOSIS — N401 Enlarged prostate with lower urinary tract symptoms: Secondary | ICD-10-CM | POA: Diagnosis not present

## 2021-04-13 DIAGNOSIS — R351 Nocturia: Secondary | ICD-10-CM | POA: Diagnosis not present

## 2021-04-13 DIAGNOSIS — E538 Deficiency of other specified B group vitamins: Secondary | ICD-10-CM | POA: Diagnosis not present

## 2021-04-13 LAB — LIPID PANEL
Cholesterol: 214 mg/dL — ABNORMAL HIGH (ref 0–200)
HDL: 72.5 mg/dL (ref >39.00)
LDL Cholesterol: 126 mg/dL — ABNORMAL HIGH (ref 0–99)
NonHDL: 141.43
Total CHOL/HDL Ratio: 3
Triglycerides: 79 mg/dL (ref 0.0–149.0)
VLDL: 15.8 mg/dL (ref 0.0–40.0)

## 2021-04-13 LAB — COMPREHENSIVE METABOLIC PANEL
ALT: 16 U/L (ref 0–53)
AST: 17 U/L (ref 0–37)
Albumin: 4.8 g/dL (ref 3.5–5.2)
Alkaline Phosphatase: 38 U/L — ABNORMAL LOW (ref 39–117)
BUN: 20 mg/dL (ref 6–23)
CO2: 31 mEq/L (ref 19–32)
Calcium: 10.2 mg/dL (ref 8.4–10.5)
Chloride: 104 mEq/L (ref 96–112)
Creatinine, Ser: 1.13 mg/dL (ref 0.40–1.50)
GFR: 64.9 mL/min (ref >60.00)
Glucose, Bld: 98 mg/dL (ref 70–99)
Potassium: 4.8 mEq/L (ref 3.5–5.1)
Sodium: 141 mEq/L (ref 135–145)
Total Bilirubin: 0.6 mg/dL (ref 0.2–1.2)
Total Protein: 6.8 g/dL (ref 6.0–8.3)

## 2021-04-13 LAB — CBC WITH DIFFERENTIAL/PLATELET
Basophils Absolute: 0.1 10*3/uL (ref 0.0–0.1)
Basophils Relative: 1.2 % (ref 0.0–3.0)
Eosinophils Absolute: 0.1 10*3/uL (ref 0.0–0.7)
Eosinophils Relative: 1.6 % (ref 0.0–5.0)
HCT: 45.2 % (ref 39.0–52.0)
Hemoglobin: 15.2 g/dL (ref 13.0–17.0)
Lymphocytes Relative: 28.1 % (ref 12.0–46.0)
Lymphs Abs: 1.6 10*3/uL (ref 0.7–4.0)
MCHC: 33.6 g/dL (ref 30.0–36.0)
MCV: 93 fl (ref 78.0–100.0)
Monocytes Absolute: 0.5 10*3/uL (ref 0.1–1.0)
Monocytes Relative: 8.6 % (ref 3.0–12.0)
Neutro Abs: 3.4 10*3/uL (ref 1.4–7.7)
Neutrophils Relative %: 60.5 % (ref 43.0–77.0)
Platelets: 219 10*3/uL (ref 150.0–400.0)
RBC: 4.86 Mil/uL (ref 4.22–5.81)
RDW: 14.3 % (ref 11.5–15.5)
WBC: 5.7 10*3/uL (ref 4.0–10.5)

## 2021-04-13 LAB — PSA: PSA: 1.06 ng/mL (ref 0.10–4.00)

## 2021-04-13 LAB — VITAMIN B12: Vitamin B-12: 368 pg/mL (ref 211–911)

## 2021-04-13 LAB — FOLATE: Folate: 18.8 ng/mL (ref >5.9)

## 2021-04-13 NOTE — Progress Notes (Signed)
Jacob [name]. ?DOB: 08-16-1950 ?Encounter date: 04/13/2021 ? ?This is a 71 y.o. male who presents for complete physical  ? ?History of present illness/Additional concerns: ?Going to [place] for bicycle trip in June. Going with backroads. Has been working on training regimen. Has gained some weight after vacation last week. Still seeing Dr. Tamala Julian on regular basis. Has helped him a lot. No issues with full game of golf. Piraformis issue resolved.  ? ?Colonoscopy due 02/2022 ?Following with derm regularly, following with new doc there.  ?Sees Dr. Herbert Deaner for regulareye exams.  ? ?HTN: good at home.  ? ?Taking B12 m,w,f 1069mg. Foot twitch/spasm has resolved. ? ?Energy level is good.  ? ?Declines shingles vaccine. Had shingles in his 381's Hasn't read data to support benefit of getting immunized.  ? ?Had mild case of covid a year ago. Has been exposed since then, but hasn't been sick.  ? ?Declines prevnar 20.  ? ?Past Medical History:  ?Diagnosis Date  ? BPH associated with nocturia   ? Hyperlipidemia   ? ?Past Surgical History:  ?Procedure Laterality Date  ? APPENDECTOMY  1973  ? KNEE ARTHROSCOPY Left   ? x2  ? ROTATOR CUFF REPAIR Bilateral   ? ?Allergies  ?Allergen Reactions  ? Other Other (See Comments)  ?  Pet dander, tree mold, ragweed, pollen cause sneezing and drainage  ? ?Current Meds  ?Medication Sig  ? Cetirizine HCl (ZYRTEC ALLERGY PO) Take by mouth daily.  ? meloxicam (MOBIC) 15 MG tablet TAKE 1 TABLET (15 MG TOTAL) BY MOUTH DAILY.  ? Multiple Vitamins-Minerals (MULTIVITAMIN ADULTS PO) Take by mouth.  ? Probiotic Product (PROBIOTIC PO) Take by mouth daily.  ? ?Social History  ? ?Tobacco Use  ? Smoking status: Former  ?  Types: Cigarettes  ?  Quit date: 13 ?  Years since quitting: 52.2  ? Smokeless tobacco: Never  ?Substance Use Topics  ? Alcohol use: Yes  ?  Alcohol/week: 7.0 standard drinks  ?  Types: 7 Standard drinks or equivalent per week  ?  Comment: 2 glasses wine nightly; 4 shot liquor  ? ?Family  History  ?Problem Relation Age of Onset  ? Sarcoidosis Mother   ?     lung  ? Heart failure Father 937 ? Rheum arthritis Father   ? Heart attack Maternal Grandfather 60  ? Other Paternal Grandfather 950 ? Gout Brother   ? Diabetes Neg Hx   ? ? ? ?Review of Systems  ?Constitutional:  Negative for activity change, appetite change, chills, fatigue, fever and unexpected weight change.  ?HENT:  Negative for congestion, ear pain, hearing loss, sinus pressure, sinus pain, sore throat and trouble swallowing.   ?Eyes:  Negative for pain and visual disturbance.  ?Respiratory:  Negative for cough, chest tightness, shortness of breath and wheezing.   ?Cardiovascular:  Negative for chest pain, palpitations and leg swelling.  ?Gastrointestinal:  Negative for abdominal distention, abdominal pain, blood in stool, constipation, diarrhea, nausea and vomiting.  ?Genitourinary:  Negative for decreased urine volume, difficulty urinating, dysuria, penile pain and testicular pain.  ?Musculoskeletal:  Negative for arthralgias, back pain and joint swelling.  ?Skin:  Negative for rash.  ?Neurological:  Negative for dizziness, weakness, numbness and headaches.  ?Hematological:  Negative for adenopathy. Does not bruise/bleed easily.  ?Psychiatric/Behavioral:  Negative for agitation, sleep disturbance and suicidal ideas. The patient is not nervous/anxious.   ? ?CBC:  ?Lab Results  ?Component Value Date  ? WBC 5.4 02/26/2020  ?  HGB 14.5 02/26/2020  ? HCT 43.3 02/26/2020  ? MCHC 33.6 02/26/2020  ? RDW 14.1 02/26/2020  ? PLT 204.0 02/26/2020  ? ?CMP: ?Lab Results  ?Component Value Date  ? NA 139 02/26/2020  ? K 4.3 02/26/2020  ? CL 103 02/26/2020  ? CO2 32 02/26/2020  ? GLUCOSE 95 02/26/2020  ? BUN 20 02/26/2020  ? CREATININE 1.17 02/26/2020  ? GFRAA 89 09/07/2006  ? CALCIUM 9.7 02/26/2020  ? PROT 6.6 02/26/2020  ? BILITOT 0.5 02/26/2020  ? ALKPHOS 41 02/26/2020  ? ALT 16 02/26/2020  ? AST 15 02/26/2020  ? ?LIPID: ?Lab Results  ?Component Value  Date  ? CHOL 202 (H) 02/26/2020  ? TRIG 118.0 02/26/2020  ? HDL 62.60 02/26/2020  ? LDLCALC 116 (H) 02/26/2020  ? ? ?Objective: ? ?BP 120/80 (BP Location: Left Arm, Patient Position: Sitting, Cuff Size: Large)   Pulse 60   Temp 97.7 ?F (36.5 ?C) (Oral)   Ht 5' 10.5" (1.791 m)   Wt 184 lb 8 oz (83.7 kg)   SpO2 99%   BMI 26.10 kg/m?   Weight: 184 lb 8 oz (83.7 kg)  ? ?BP Readings from Last 3 Encounters:  ?04/13/21 120/80  ?03/03/21 122/82  ?01/13/21 112/78  ? ?Wt Readings from Last 3 Encounters:  ?04/13/21 184 lb 8 oz (83.7 kg)  ?03/03/21 177 lb (80.3 kg)  ?01/13/21 174 lb (78.9 kg)  ? ? ?Physical Exam ?Constitutional:   ?   General: He is not in acute distress. ?   Appearance: He is well-developed.  ?HENT:  ?   Head: Normocephalic and atraumatic.  ?   Right Ear: External ear normal.  ?   Left Ear: External ear normal.  ?   Nose: Nose normal.  ?   Mouth/Throat:  ?   Pharynx: No oropharyngeal exudate.  ?Eyes:  ?   Conjunctiva/sclera: Conjunctivae normal.  ?   Pupils: Pupils are equal, round, and reactive to light.  ?Neck:  ?   Thyroid: No thyromegaly.  ?Cardiovascular:  ?   Rate and Rhythm: Normal rate and regular rhythm.  ?   Heart sounds: Normal heart sounds. No murmur heard. ?  No friction rub. No gallop.  ?Pulmonary:  ?   Effort: Pulmonary effort is normal. No respiratory distress.  ?   Breath sounds: Normal breath sounds. No stridor. No wheezing or rales.  ?Abdominal:  ?   General: Bowel sounds are normal.  ?   Palpations: Abdomen is soft.  ?Musculoskeletal:     ?   General: Normal range of motion.  ?   Cervical back: Neck supple.  ?Skin: ?   General: Skin is warm and dry.  ?Neurological:  ?   Mental Status: He is alert and oriented to person, place, and time.  ?Psychiatric:     ?   Behavior: Behavior normal.     ?   Thought Content: Thought content normal.     ?   Judgment: Judgment normal.  ? ? ?Assessment/Plan: ?There are no preventive care reminders to display for this patient. ?Health Maintenance  reviewed. ? ?1. B12 deficiency ?He is supplementing a few days/week. Keep up with healthy lifestyle! ?- CBC with Differential/Platelet; Future ?- Vitamin B12; Future ?- Folate; Future ? ?2. Mixed hyperlipidemia ?Diet controlled ?- Comprehensive metabolic panel; Future ?- Lipid panel; Future ? ?3. BPH associated with nocturia ?- PSA; Future ? ? ?Return in about 1 year (around 04/14/2022) for physical exam. ? ?Micheline Rough, MD ? ? ? ? ? ?

## 2021-04-14 ENCOUNTER — Other Ambulatory Visit: Payer: Self-pay

## 2021-04-14 ENCOUNTER — Encounter: Payer: Self-pay | Admitting: Family Medicine

## 2021-04-14 ENCOUNTER — Ambulatory Visit (INDEPENDENT_AMBULATORY_CARE_PROVIDER_SITE_OTHER): Payer: Medicare Other | Admitting: Family Medicine

## 2021-04-14 VITALS — BP 118/82 | HR 66 | Ht 70.5 in | Wt 182.0 lb

## 2021-04-14 DIAGNOSIS — M9901 Segmental and somatic dysfunction of cervical region: Secondary | ICD-10-CM

## 2021-04-14 DIAGNOSIS — M9908 Segmental and somatic dysfunction of rib cage: Secondary | ICD-10-CM | POA: Diagnosis not present

## 2021-04-14 DIAGNOSIS — M9903 Segmental and somatic dysfunction of lumbar region: Secondary | ICD-10-CM

## 2021-04-14 DIAGNOSIS — M9904 Segmental and somatic dysfunction of sacral region: Secondary | ICD-10-CM

## 2021-04-14 DIAGNOSIS — M533 Sacrococcygeal disorders, not elsewhere classified: Secondary | ICD-10-CM | POA: Diagnosis not present

## 2021-04-14 DIAGNOSIS — M9902 Segmental and somatic dysfunction of thoracic region: Secondary | ICD-10-CM

## 2021-04-14 NOTE — Patient Instructions (Signed)
See you before trip and after [place] trip ?Good to see you! ?

## 2021-04-14 NOTE — Assessment & Plan Note (Signed)
Discussed HEP   ?Discussed which activities to do discussed with patient icing regimen and home exercises.  Patient has done relatively well but did have some more tightness of the neck noted today.  Patient will come back again in 6 to 8 weeks ?

## 2021-06-16 NOTE — Progress Notes (Deleted)
  [place] Phone: [phone] Subjective:    I'm seeing this patient by the request  of:  [name], MD  CC:   [id number]  Jacob [name]. is a 71 y.o. male coming in with complaint of back and neck pain. OMT 04/14/2021. Patient states   Medications patient has been prescribed: Meloxicam  Taking:         Reviewed prior external information including notes and imaging from previsou exam, outside providers and external EMR if available.   As well as notes that were available from care everywhere and other healthcare systems.  Past medical history, social, surgical and family history all reviewed in electronic medical record.  No pertanent information unless stated regarding to the chief complaint.   Past Medical History:  Diagnosis Date   BPH associated with nocturia    Hyperlipidemia     Allergies  Allergen Reactions   Other Other (See Comments)    Pet dander, tree mold, ragweed, pollen cause sneezing and drainage     Review of Systems:  No headache, visual changes, nausea, vomiting, diarrhea, constipation, dizziness, abdominal pain, skin rash, fevers, chills, night sweats, weight loss, swollen lymph nodes, body aches, joint swelling, chest pain, shortness of breath, mood changes. POSITIVE muscle aches  Objective  There were no vitals taken for this visit.   General: No apparent distress alert and oriented x3 mood and affect normal, dressed appropriately.  HEENT: Pupils equal, extraocular movements intact  Respiratory: Patient's speak in full sentences and does not appear short of breath  Cardiovascular: No lower extremity edema, non tender, no erythema  Neuro: Cranial nerves II through XII are intact, neurovascularly intact in all extremities with 2+ DTRs and 2+ pulses.  Gait normal with good balance and coordination.  MSK:  Non tender with full range of motion and good stability  and symmetric strength and tone of shoulders, elbows, wrist, hip, knee and ankles bilaterally.  Back - Normal skin, Spine with normal alignment and no deformity.  No tenderness to vertebral process palpation.  Paraspinous muscles are not tender and without spasm.   Range of motion is full at neck and lumbar sacral regions  Osteopathic findings  C2 flexed rotated and side bent right C6 flexed rotated and side bent left T3 extended rotated and side bent right inhaled rib T9 extended rotated and side bent left L2 flexed rotated and side bent right Sacrum right on right       Assessment and Plan:    Nonallopathic problems  Decision today to treat with OMT was based on Physical Exam  After verbal consent patient was treated with HVLA, ME, FPR techniques in cervical, rib, thoracic, lumbar, and sacral  areas  Patient tolerated the procedure well with improvement in symptoms  Patient given exercises, stretches and lifestyle modifications  See medications in patient instructions if given  Patient will follow up in 4-8 weeks      The above documentation has been reviewed and is accurate and complete Jacob [name]       Note: This dictation was prepared with Dragon dictation along with smaller phrase technology. Any transcriptional errors that result from this process are unintentional.

## 2021-06-20 DIAGNOSIS — J101 Influenza due to other identified influenza virus with other respiratory manifestations: Secondary | ICD-10-CM | POA: Diagnosis not present

## 2021-06-20 DIAGNOSIS — R059 Cough, unspecified: Secondary | ICD-10-CM | POA: Diagnosis not present

## 2021-06-22 ENCOUNTER — Ambulatory Visit: Payer: Medicare Other | Admitting: Family Medicine

## 2021-06-30 ENCOUNTER — Ambulatory Visit (INDEPENDENT_AMBULATORY_CARE_PROVIDER_SITE_OTHER): Payer: Medicare Other | Admitting: Family Medicine

## 2021-06-30 VITALS — BP 118/90 | HR 59 | Ht 70.0 in | Wt 183.0 lb

## 2021-06-30 DIAGNOSIS — M9908 Segmental and somatic dysfunction of rib cage: Secondary | ICD-10-CM | POA: Diagnosis not present

## 2021-06-30 DIAGNOSIS — M9904 Segmental and somatic dysfunction of sacral region: Secondary | ICD-10-CM | POA: Diagnosis not present

## 2021-06-30 DIAGNOSIS — M9903 Segmental and somatic dysfunction of lumbar region: Secondary | ICD-10-CM | POA: Diagnosis not present

## 2021-06-30 DIAGNOSIS — M533 Sacrococcygeal disorders, not elsewhere classified: Secondary | ICD-10-CM

## 2021-06-30 DIAGNOSIS — M9902 Segmental and somatic dysfunction of thoracic region: Secondary | ICD-10-CM

## 2021-06-30 DIAGNOSIS — M9901 Segmental and somatic dysfunction of cervical region: Secondary | ICD-10-CM

## 2021-06-30 MED ORDER — PREDNISONE 20 MG PO TABS
20.0000 mg | ORAL_TABLET | Freq: Every day | ORAL | 0 refills | Status: DC
Start: 2021-06-30 — End: 2021-11-19

## 2021-06-30 NOTE — Assessment & Plan Note (Signed)
For chronic pain with exacerbation.  Patient does have significant amount of discomfort.  Difficult to do the manipulation today.  Given prednisone to have on hand in case patient needs it while he is traveling.  Discussed which activities to do and which ones to avoid, increase activity slowly.  Patient will be riding his bike for long amount of time and encourage patient to do the stretches regularly.  Follow-up with me again in 6 to 8 weeks otherwise.

## 2021-06-30 NOTE — Progress Notes (Signed)
Jacob [name] [place] [address] [place] Phone: [phone] Subjective:   Jacob [name], am serving as a scribe for Dr. Hulan Saas.  I'm seeing this patient by the request  of:  [name], MD  CC: Neck and back pain follow-up  [id number]  Jacob [name]. is a 71 y.o. male coming in with complaint of back and neck pain. OMT on 04/14/2021. Patient states long days driving and bad beds feeling sore. No new complaints.  Medications patient has been prescribed:   Taking:         Reviewed prior external information including notes and imaging from previsou exam, outside providers and external EMR if available.   As well as notes that were available from care everywhere and other healthcare systems.  Past medical history, social, surgical and family history all reviewed in electronic medical record.  No pertanent information unless stated regarding to the chief complaint.   Past Medical History:  Diagnosis Date   BPH associated with nocturia    Hyperlipidemia     Allergies  Allergen Reactions   Other Other (See Comments)    Pet dander, tree mold, ragweed, pollen cause sneezing and drainage     Review of Systems:  No headache, visual changes, nausea, vomiting, diarrhea, constipation, dizziness, abdominal pain, skin rash, fevers, chills, night sweats, weight loss, swollen lymph nodes, body aches, joint swelling, chest pain, shortness of breath, mood changes. POSITIVE muscle aches  Objective  Blood pressure 118/90, pulse (!) 59, height '5\' 10"'$  (1.778 m), weight 183 lb (83 kg), SpO2 96 %.   General: No apparent distress alert and oriented x3 mood and affect normal, dressed appropriately.  HEENT: Pupils equal, extraocular movements intact  Respiratory: Patient's speak in full sentences and does not appear short of breath  Cardiovascular: No lower extremity edema, non tender, no erythema  Neck exam does have some loss of  lordosis.  Some tenderness to palpation over the paraspinal musculature.  Patient does have Discomfort and pain over the sacroiliac joint left greater than right.  Fairly severe overall.  Difficulty with FABER bilaterally.  Negative straight leg test.  Osteopathic findings  C2 flexed rotated and side bent right C6 flexed rotated and side bent left T3 extended rotated and side bent right inhaled rib T9 extended rotated and side bent left L2 flexed rotated and side bent left L4 flexed rotated and side bent right Sacrum left on left       Assessment and Plan: SI (sacroiliac) joint dysfunction For chronic pain with exacerbation.  Patient does have significant amount of discomfort.  Difficult to do the manipulation today.  Given prednisone to have on hand in case patient needs it while he is traveling.  Discussed which activities to do and which ones to avoid, increase activity slowly.  Patient will be riding his bike for long amount of time and encourage patient to do the stretches regularly.  Follow-up with me again in 6 to 8 weeks otherwise.    Nonallopathic problems  Decision today to treat with OMT was based on Physical Exam  After verbal consent patient was treated with HVLA, ME, FPR techniques in cervical, rib, thoracic, lumbar, and sacral  areas  Patient tolerated the procedure well with improvement in symptoms  Patient given exercises, stretches and lifestyle modifications  See medications in patient instructions if given  Patient will follow up in 4-8 weeks     The above documentation has been reviewed and  is accurate and complete [name], DO        Note: This dictation was prepared with Dragon dictation along with smaller phrase technology. Any transcriptional errors that result from this process are unintentional.

## 2021-06-30 NOTE — Patient Instructions (Addendum)
Have a great time in [place] Prescription filled Want to see pictures See you again in 3-4 weeks

## 2021-07-20 ENCOUNTER — Ambulatory Visit: Payer: Medicare Other | Admitting: Family Medicine

## 2021-07-22 NOTE — Progress Notes (Unsigned)
  [place] Phone: [phone] Subjective:    I'm seeing this patient by the request  of:  [name], MD (Inactive)  CC:   [id number]  Jacob [name]. is a 71 y.o. male coming in with complaint of back and neck pain. OMT 06/30/2021. Patient states   Medications patient has been prescribed: Prednisone  Taking:         Reviewed prior external information including notes and imaging from previsou exam, outside providers and external EMR if available.   As well as notes that were available from care everywhere and other healthcare systems.  Past medical history, social, surgical and family history all reviewed in electronic medical record.  No pertanent information unless stated regarding to the chief complaint.   Past Medical History:  Diagnosis Date   BPH associated with nocturia    Hyperlipidemia     Allergies  Allergen Reactions   Other Other (See Comments)    Pet dander, tree mold, ragweed, pollen cause sneezing and drainage     Review of Systems:  No headache, visual changes, nausea, vomiting, diarrhea, constipation, dizziness, abdominal pain, skin rash, fevers, chills, night sweats, weight loss, swollen lymph nodes, body aches, joint swelling, chest pain, shortness of breath, mood changes. POSITIVE muscle aches  Objective  There were no vitals taken for this visit.   General: No apparent distress alert and oriented x3 mood and affect normal, dressed appropriately.  HEENT: Pupils equal, extraocular movements intact  Respiratory: Patient's speak in full sentences and does not appear short of breath  Cardiovascular: No lower extremity edema, non tender, no erythema  Gait MSK:  Back   Osteopathic findings  C2 flexed rotated and side bent right C6 flexed rotated and side bent left T3 extended rotated and side bent right inhaled rib T9 extended rotated and side bent left L2  flexed rotated and side bent right Sacrum right on right       Assessment and Plan:  No problem-specific Assessment & Plan notes found for this encounter.    Nonallopathic problems  Decision today to treat with OMT was based on Physical Exam  After verbal consent patient was treated with HVLA, ME, FPR techniques in cervical, rib, thoracic, lumbar, and sacral  areas  Patient tolerated the procedure well with improvement in symptoms  Patient given exercises, stretches and lifestyle modifications  See medications in patient instructions if given  Patient will follow up in 4-8 weeks             Note: This dictation was prepared with Dragon dictation along with smaller phrase technology. Any transcriptional errors that result from this process are unintentional.

## 2021-07-28 ENCOUNTER — Ambulatory Visit (INDEPENDENT_AMBULATORY_CARE_PROVIDER_SITE_OTHER): Payer: Medicare Other | Admitting: Family Medicine

## 2021-07-28 VITALS — BP 120/80 | HR 63 | Ht 70.0 in | Wt 180.0 lb

## 2021-07-28 DIAGNOSIS — M533 Sacrococcygeal disorders, not elsewhere classified: Secondary | ICD-10-CM

## 2021-07-28 DIAGNOSIS — M9903 Segmental and somatic dysfunction of lumbar region: Secondary | ICD-10-CM | POA: Diagnosis not present

## 2021-07-28 DIAGNOSIS — M9902 Segmental and somatic dysfunction of thoracic region: Secondary | ICD-10-CM | POA: Diagnosis not present

## 2021-07-28 DIAGNOSIS — M9908 Segmental and somatic dysfunction of rib cage: Secondary | ICD-10-CM

## 2021-07-28 DIAGNOSIS — M9904 Segmental and somatic dysfunction of sacral region: Secondary | ICD-10-CM | POA: Diagnosis not present

## 2021-07-28 DIAGNOSIS — M9901 Segmental and somatic dysfunction of cervical region: Secondary | ICD-10-CM | POA: Diagnosis not present

## 2021-08-09 ENCOUNTER — Encounter: Payer: Self-pay | Admitting: Family Medicine

## 2021-08-11 ENCOUNTER — Ambulatory Visit (INDEPENDENT_AMBULATORY_CARE_PROVIDER_SITE_OTHER): Payer: Medicare Other | Admitting: Sports Medicine

## 2021-08-11 ENCOUNTER — Ambulatory Visit (INDEPENDENT_AMBULATORY_CARE_PROVIDER_SITE_OTHER): Payer: Medicare Other

## 2021-08-11 VITALS — BP 122/82 | HR 86 | Ht 70.0 in | Wt 180.0 lb

## 2021-08-11 DIAGNOSIS — M533 Sacrococcygeal disorders, not elsewhere classified: Secondary | ICD-10-CM | POA: Diagnosis not present

## 2021-08-11 DIAGNOSIS — M160 Bilateral primary osteoarthritis of hip: Secondary | ICD-10-CM | POA: Diagnosis not present

## 2021-08-11 DIAGNOSIS — M9903 Segmental and somatic dysfunction of lumbar region: Secondary | ICD-10-CM | POA: Diagnosis not present

## 2021-08-11 DIAGNOSIS — M9905 Segmental and somatic dysfunction of pelvic region: Secondary | ICD-10-CM

## 2021-08-11 DIAGNOSIS — M9904 Segmental and somatic dysfunction of sacral region: Secondary | ICD-10-CM

## 2021-08-11 NOTE — Patient Instructions (Addendum)
Good to see you  complete an addition 7-10 days of meloxicam  Continue HEP and stretching  As needed follow up

## 2021-08-11 NOTE — Progress Notes (Signed)
    Benito Mccreedy D.[place] Phone: [phone]   Assessment and Plan:     1. SI (sacroiliac) joint dysfunction 2. Somatic dysfunction of lumbar region 3. Somatic dysfunction of pelvic region 4. Somatic dysfunction of sacral region -Chronic with exacerbation, subsequent sports medicine visit - Recurrence of SI joint pain after patient jumped into a sand bunker while playing golf - Patient just completed a 5-day course of prednisone.  Discontinue prednisone at this time - May restart meloxicam for an additional 7 to 10-day course - Patient has received significant relief with OMT in the past.  Elects for repeat OMT today.  Tolerated well per note below. - Decision today to treat with OMT was based on Physical Exam -X-ray obtained in clinic.  My interpretation: No acute fracture or dislocation.  After verbal consent patient was treated with HVLA (high velocity low amplitude), ME (muscle energy), FPR (flex positional release), ST (soft tissue), PC/PD (Pelvic Compression/ Pelvic Decompression) techniques in sacrum, lumbar, and pelvic areas. Patient tolerated the procedure well with improvement in symptoms.  Patient educated on potential side effects of soreness and recommended to rest, hydrate, and use Tylenol as needed for pain control.    Pertinent previous records reviewed include none   Follow Up: Patient has follow-up appointment already scheduled with Dr. Tamala Julian.  Could consider repeat OMT at that time   Subjective:   I, Pincus Badder, am serving as a Education administrator for Doctor Glennon Mac  Chief Complaint: si joint pain   HPI:   08/11/21 Patient is a 71 year old male complaining of si joint pain . Patient states he is flared up after 2 rounds of golf  6 days ago has taken prednisone and Voltaren gel and he is able to walk and do all the things and he feels okay he states he is really tight and has been doing  all the stretching and what not , here for some OMT   Relevant Historical Information: None pertinent  Additional pertinent review of systems negative.   Current Outpatient Medications:    Cetirizine HCl (ZYRTEC ALLERGY PO), Take by mouth daily., Disp: , Rfl:    meloxicam (MOBIC) 15 MG tablet, TAKE 1 TABLET (15 MG TOTAL) BY MOUTH DAILY., Disp: 30 tablet, Rfl: 0   Multiple Vitamins-Minerals (MULTIVITAMIN ADULTS PO), Take by mouth., Disp: , Rfl:    predniSONE (DELTASONE) 20 MG tablet, Take 1 tablet (20 mg total) by mouth daily with breakfast., Disp: 5 tablet, Rfl: 0   Probiotic Product (PROBIOTIC PO), Take by mouth daily., Disp: , Rfl:    Objective:     Vitals:   08/11/21 1546  BP: 122/82  Pulse: 86  SpO2: 98%  Weight: 180 lb (81.6 kg)  Height: '5\' 10"'$  (1.778 m)      Body mass index is 25.83 kg/m.    Physical Exam:    General: Well-appearing, cooperative, sitting comfortably in no acute distress.   OMT Physical Exam:  ASIS Compression Test: Positive left Sacrum: Positive sphinx, TTP left sacral base Lumbar: Mild left-sided TTP paraspinal, L5 RR Pelvis: Left posterior innominate  Mild discomfort with left SI gapping  Electronically signed by:  Benito Mccreedy D.[facility] 4:14 PM 08/11/21

## 2021-09-11 NOTE — Progress Notes (Unsigned)
Jacob [name] [address] [place] Phone: [phone] Subjective:   IVilma [name], am serving as a scribe for Dr. Hulan Saas.  I'm seeing this patient by the request  of:  Koberlein, Steele Berg, MD (Inactive)  CC: back and neck pain   [id number]  Jacob [name]. is a 71 y.o. male coming in with complaint of back and neck pain. OMT 08/11/2021. Patient states doing well. Same per usual. No new issues.  Medications patient has been prescribed: Meloxicam            Reviewed prior external information including notes and imaging from previsou exam, outside providers and external EMR if available.   As well as notes that were available from care everywhere and other healthcare systems.  Past medical history, social, surgical and family history all reviewed in electronic medical record.  No pertanent information unless stated regarding to the chief complaint.   Past Medical History:  Diagnosis Date   BPH associated with nocturia    Hyperlipidemia     Allergies  Allergen Reactions   Other Other (See Comments)    Pet dander, tree mold, ragweed, pollen cause sneezing and drainage     Review of Systems:  No headache, visual changes, nausea, vomiting, diarrhea, constipation, dizziness, abdominal pain, skin rash, fevers, chills, night sweats, weight loss, swollen lymph nodes, body aches, joint swelling, chest pain, shortness of breath, mood changes. POSITIVE muscle aches  Objective  Blood pressure 122/82, pulse 73, height '5\' 10"'$  (1.778 m), weight 183 lb (83 kg), SpO2 98 %.   General: No apparent distress alert and oriented x3 mood and affect normal, dressed appropriately.  HEENT: Pupils equal, extraocular movements intact  Respiratory: Patient's speak in full sentences and does not appear short of breath  Cardiovascular: No lower extremity edema, non tender, no erythema  Gait normal  MSK:  Back does have some loss of  lordosis patient does have some tightness with FABER test right greater than left.  Negative straight leg test.  Mild tightness noted in the neck as well.  Lacks last 5 degrees of extension.  Negative Spurling's  Osteopathic findings  C2 flexed rotated and side bent right C7 flexed rotated and side bent left T3 extended rotated and side bent right inhaled rib T8 extended rotated and side bent left L2 flexed rotated and side bent right Sacrum right on right    Assessment and Plan:  SI (sacroiliac) joint dysfunction Chronic, with mild exacerbation.  Continues to have tightness and radicular symptoms.  Has responded well to the osteopathic manipulation.  We will be doing a significant amount of bike riding here in the near future and will follow-up with me again in 6 weeks    Nonallopathic problems  Decision today to treat with OMT was based on Physical Exam  After verbal consent patient was treated with HVLA, ME, FPR techniques in cervical, rib, thoracic, lumbar, and sacral  areas  Patient tolerated the procedure well with improvement in symptoms  Patient given exercises, stretches and lifestyle modifications  See medications in patient instructions if given  Patient will follow up in 4-8 weeks     The above documentation has been reviewed and is accurate and complete [name], DO         Note: This dictation was prepared with Dragon dictation along with smaller phrase technology. Any transcriptional errors that result from this process are unintentional.

## 2021-09-15 ENCOUNTER — Ambulatory Visit (INDEPENDENT_AMBULATORY_CARE_PROVIDER_SITE_OTHER): Payer: Medicare Other | Admitting: Family Medicine

## 2021-09-15 ENCOUNTER — Encounter: Payer: Self-pay | Admitting: Family Medicine

## 2021-09-15 VITALS — BP 122/82 | HR 73 | Ht 70.0 in | Wt 183.0 lb

## 2021-09-15 DIAGNOSIS — M533 Sacrococcygeal disorders, not elsewhere classified: Secondary | ICD-10-CM

## 2021-09-15 DIAGNOSIS — M9903 Segmental and somatic dysfunction of lumbar region: Secondary | ICD-10-CM | POA: Diagnosis not present

## 2021-09-15 DIAGNOSIS — M9902 Segmental and somatic dysfunction of thoracic region: Secondary | ICD-10-CM

## 2021-09-15 DIAGNOSIS — M9908 Segmental and somatic dysfunction of rib cage: Secondary | ICD-10-CM | POA: Diagnosis not present

## 2021-09-15 DIAGNOSIS — M9904 Segmental and somatic dysfunction of sacral region: Secondary | ICD-10-CM

## 2021-09-15 DIAGNOSIS — M9901 Segmental and somatic dysfunction of cervical region: Secondary | ICD-10-CM

## 2021-09-15 NOTE — Patient Instructions (Signed)
Good to see you! Thanks for letting me talk about liquid refreshments

## 2021-09-15 NOTE — Assessment & Plan Note (Signed)
Chronic, with mild exacerbation.  Continues to have tightness and radicular symptoms.  Has responded well to the osteopathic manipulation.  We will be doing a significant amount of bike riding here in the near future and will follow-up with me again in 6 weeks

## 2021-09-25 DIAGNOSIS — L814 Other melanin hyperpigmentation: Secondary | ICD-10-CM | POA: Diagnosis not present

## 2021-09-25 DIAGNOSIS — L430 Hypertrophic lichen planus: Secondary | ICD-10-CM | POA: Diagnosis not present

## 2021-09-25 DIAGNOSIS — D485 Neoplasm of uncertain behavior of skin: Secondary | ICD-10-CM | POA: Diagnosis not present

## 2021-09-25 DIAGNOSIS — D1801 Hemangioma of skin and subcutaneous tissue: Secondary | ICD-10-CM | POA: Diagnosis not present

## 2021-09-25 DIAGNOSIS — C44719 Basal cell carcinoma of skin of left lower limb, including hip: Secondary | ICD-10-CM | POA: Diagnosis not present

## 2021-09-25 DIAGNOSIS — L821 Other seborrheic keratosis: Secondary | ICD-10-CM | POA: Diagnosis not present

## 2021-09-25 DIAGNOSIS — D225 Melanocytic nevi of trunk: Secondary | ICD-10-CM | POA: Diagnosis not present

## 2021-09-25 DIAGNOSIS — L570 Actinic keratosis: Secondary | ICD-10-CM | POA: Diagnosis not present

## 2021-10-26 NOTE — Progress Notes (Unsigned)
[place] Phone: [phone] Subjective:   Jacob [name], am serving as a scribe for Dr. Hulan Saas.  I'm seeing this patient by the request  of:  Koberlein, Steele Berg, MD (Inactive)  CC: Low back pain, right foot pain follow-up  [id number]  Jacob Lough. is a 71 y.o. male coming in with complaint of back and neck pain. OMT 09/15/2021. Patient states that he rode 30 miles on a bike and had increase in R foot pain over lateral aspect.   Back pain is doing well. Has been traveling a lot by car.   Used meloxicam and voltaren for foot.   Medications patient has been prescribed: Meloxicam  Taking:         Reviewed prior external information including notes and imaging from previsou exam, outside providers and external EMR if available.   As well as notes that were available from care everywhere and other healthcare systems.  Past medical history, social, surgical and family history all reviewed in electronic medical record.  [name] pertanent information unless stated regarding to the chief complaint.   Past Medical History:  Diagnosis Date   BPH associated with nocturia    Hyperlipidemia     Allergies  Allergen Reactions   Other Other (See Comments)    Pet dander, tree mold, ragweed, pollen cause sneezing and drainage     Review of Systems:  [name] headache, visual changes, nausea, vomiting, diarrhea, constipation, dizziness, abdominal pain, skin rash, fevers, chills, night sweats, weight loss, swollen lymph nodes, body aches, joint swelling, chest pain, shortness of breath, mood changes. POSITIVE muscle aches  Objective  Blood pressure 128/84, pulse 63, height '5\' 10"'$  (1.778 m), weight 190 lb (86.2 kg), SpO2 99 %.   General: [name] apparent distress alert and oriented x3 mood and affect normal, dressed appropriately.  HEENT: Pupils equal, extraocular movements intact  Respiratory: Patient's speak in  full sentences and does not appear short of breath  Cardiovascular: [name] lower extremity edema, non tender, [name] erythema  Gait normal but favoring right foot somewhat.  Right foot exam shows that there is trace swelling noted on the dorsal aspect of the foot in the midfoot.  Rigid midfoot noted.  Patient able to tolerate palpation without any significant difficulty. MSK:  Back does have some loss of lordosis.  Some tenderness to palpation in the paraspinal musculature.  Positive Corky Sox on the right side  Osteopathic findings  T6 extended rotated and side bent left L1 flexed rotated and side bent right Sacrum right on right       Assessment and Plan:  SI (sacroiliac) joint dysfunction Chronic problem but stable.  Discussed with patient about icing regimen and home exercises.  Discussed with patient about which activities to do and which ones to avoid.  All seems to be more on the lower back at the moment.  Follow-up again in 6 to 8 weeks.  Can take meloxicam on a more regular basis if needed.  Right foot pain Does have some arthritic changes noted.  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  Discussed with patient about icing regimen and home exercises otherwise.  Follow-up with me again in 6 to 8 weeks.    Nonallopathic problems  Decision today to treat with OMT was based on Physical Exam  After verbal consent patient was treated with HVLA, ME, FPR techniques in thoracic, lumbar, and sacral  areas  Patient tolerated the procedure well  with improvement in symptoms  Patient given exercises, stretches and lifestyle modifications  See medications in patient instructions if given  Patient will follow up in 4-8 weeks    The above documentation has been reviewed and is accurate and complete [name], DO          Note: This dictation was prepared with Dragon dictation along with smaller phrase technology. Any transcriptional errors that result from this process are unintentional.

## 2021-10-27 ENCOUNTER — Encounter: Payer: Self-pay | Admitting: Family Medicine

## 2021-10-27 ENCOUNTER — Ambulatory Visit (INDEPENDENT_AMBULATORY_CARE_PROVIDER_SITE_OTHER): Payer: Medicare Other | Admitting: Family Medicine

## 2021-10-27 VITALS — BP 128/84 | HR 63 | Ht 70.0 in | Wt 190.0 lb

## 2021-10-27 DIAGNOSIS — M79671 Pain in right foot: Secondary | ICD-10-CM

## 2021-10-27 DIAGNOSIS — M9903 Segmental and somatic dysfunction of lumbar region: Secondary | ICD-10-CM | POA: Diagnosis not present

## 2021-10-27 DIAGNOSIS — M9904 Segmental and somatic dysfunction of sacral region: Secondary | ICD-10-CM

## 2021-10-27 DIAGNOSIS — M533 Sacrococcygeal disorders, not elsewhere classified: Secondary | ICD-10-CM

## 2021-10-27 DIAGNOSIS — M9902 Segmental and somatic dysfunction of thoracic region: Secondary | ICD-10-CM | POA: Diagnosis not present

## 2021-10-27 NOTE — Assessment & Plan Note (Signed)
Chronic problem but stable.  Discussed with patient about icing regimen and home exercises.  Discussed with patient about which activities to do and which ones to avoid.  All seems to be more on the lower back at the moment.  Follow-up again in 6 to 8 weeks.  Can take meloxicam on a more regular basis if needed.

## 2021-10-27 NOTE — Patient Instructions (Signed)
Enjoy your travels [place] for foot See me in 8 weeks

## 2021-10-27 NOTE — Assessment & Plan Note (Signed)
Does have some arthritic changes noted.  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  Discussed with patient about icing regimen and home exercises otherwise.  Follow-up with me again in 6 to 8 weeks.

## 2021-11-19 ENCOUNTER — Encounter: Payer: Self-pay | Admitting: Family Medicine

## 2021-11-19 ENCOUNTER — Ambulatory Visit (INDEPENDENT_AMBULATORY_CARE_PROVIDER_SITE_OTHER): Payer: Medicare Other | Admitting: Family Medicine

## 2021-11-19 VITALS — BP 120/82 | HR 65 | Temp 98.1°F | Ht 70.0 in | Wt 186.1 lb

## 2021-11-19 DIAGNOSIS — E538 Deficiency of other specified B group vitamins: Secondary | ICD-10-CM

## 2021-11-19 DIAGNOSIS — Z1211 Encounter for screening for malignant neoplasm of colon: Secondary | ICD-10-CM | POA: Diagnosis not present

## 2021-11-19 NOTE — Assessment & Plan Note (Signed)
Reviewed labs from March 2023, all WNL, he is continuing his b12 supplements. Will recheck his labs at his Annual physical in March 2024.

## 2021-11-19 NOTE — Progress Notes (Signed)
Established Patient Office Visit  Subjective   Patient ID: Jacob [name]., male    DOB: Mar 06, 1950  Age: 71 y.o. MRN: [id number]  Chief Complaint  Patient presents with   Establish Care    Patient is here for transition of care visit. Pt denies any new symptoms or issues since he last saw Dr. Ethlyn Gallery.  Pt reports that he sees Dr. Tamala Julian at [facility] for his SI joint/ back pain. Takes meloxicam as needed for his occasional back pain.   Pt has a history of B12 deficiency in the past, states that he continues to take daily B12 supplements daily.   Pt states that overall he is in very good health, with only occasional problems with aches and pain. Sees a dermatologist and ophthalmologist that he sees once a year.    Current Outpatient Medications  Medication Instructions   Cetirizine HCl (ZYRTEC ALLERGY PO) Oral, Daily   Cyanocobalamin (B-12 PO) 1,000 mcg, Oral, 3 times weekly   meloxicam (MOBIC) 15 mg, Oral, Daily   Multiple Vitamins-Minerals (MULTIVITAMIN ADULTS PO) Oral   Multiple Vitamins-Minerals (OCUVITE PO) Oral, Daily   Probiotic Product (PROBIOTIC PO) Oral, Daily    Patient Active Problem List   Diagnosis Date Noted   B12 deficiency 11/19/2021   Right foot pain 10/27/2021   Left ankle pain 10/09/2020   Seasonal allergies 06/18/2020   SI (sacroiliac) joint dysfunction 02/26/2019   Nonallopathic lesion of sacral region 02/26/2019   Nonallopathic lesion of lumbosacral region 02/26/2019   Nonallopathic lesion of thoracic region 02/26/2019   BPH associated with nocturia 11/05/2014   DEGENERATIVE DISC DISEASE, CERVICAL SPINE, W/RADICULOPATHY 03/18/2009   Hyperlipidemia 08/15/2006   Allergic rhinitis 08/15/2006      Review of Systems  All other systems reviewed and are negative.     Objective:     BP 120/82 (BP Location: Left Arm, Patient Position: Sitting, Cuff Size: Large)   Pulse 65   Temp 98.1 F (36.7 C) (Oral)   Ht '5\' 10"'$  (1.778 m)   Wt 186 lb  1.6 oz (84.4 kg)   SpO2 100%   BMI 26.70 kg/m    Physical Exam Vitals reviewed.  Constitutional:      Appearance: Normal appearance. He is well-groomed and normal weight.  Eyes:     Extraocular Movements: Extraocular movements intact.     Conjunctiva/sclera: Conjunctivae normal.  Neck:     Thyroid: No thyromegaly.  Cardiovascular:     Rate and Rhythm: Normal rate and regular rhythm.     Heart sounds: S1 normal and S2 normal. No murmur heard. Pulmonary:     Effort: Pulmonary effort is normal.     Breath sounds: Normal breath sounds and air entry. No rales.  Abdominal:     General: Abdomen is flat. Bowel sounds are normal.  Musculoskeletal:     Right lower leg: No edema.     Left lower leg: No edema.  Neurological:     General: No focal deficit present.     Mental Status: He is alert and oriented to person, place, and time.     Gait: Gait is intact.  Psychiatric:        Mood and Affect: Mood and affect normal.      No results found for any visits on 11/19/21.    The 10-year ASCVD risk score (Arnett DK, et al., 2019) is: 17.8%    Assessment & Plan:   Problem List Items Addressed This Visit  Other   B12 deficiency (Chronic)    Reviewed labs from March 2023, all WNL, he is continuing his b12 supplements. Will recheck his labs at his Annual physical in March 2024.      Other Visit Diagnoses     Colon cancer screening    -  Primary   Relevant Orders   Ambulatory referral to Gastroenterology       Return in about 5 months (around 04/20/2022) for Annual physical Exam.    Farrel Conners, MD

## 2021-12-02 IMAGING — DX DG HIP (WITH OR WITHOUT PELVIS) 2-3V*L*
2 series · 2 of 2 positions shown · non-contrast
Comparison: None.

CLINICAL DATA: 68-year-old male with sacroiliac pain.

EXAM:
DG HIP (WITH OR WITHOUT PELVIS) 2-3V LEFT

[pelvis ap]
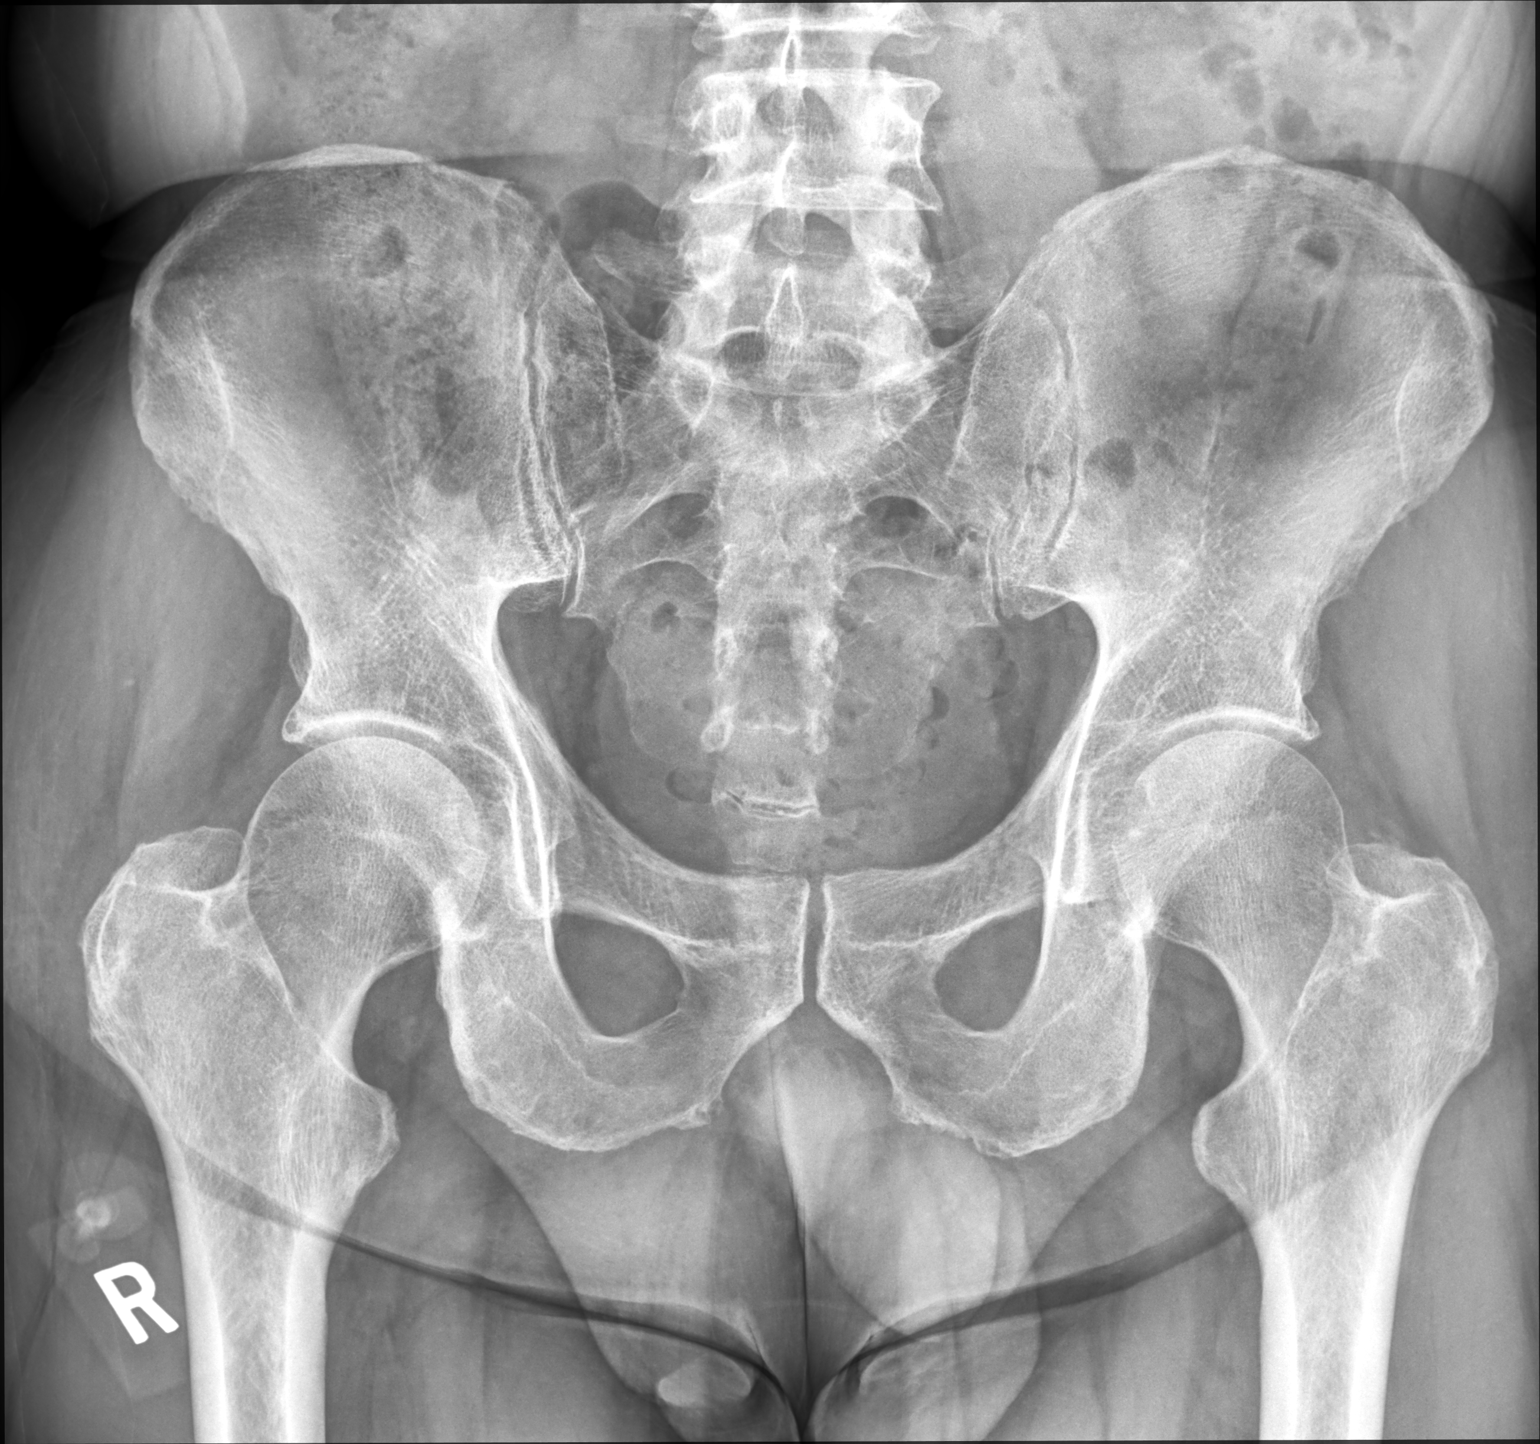

[hip (frog leg) lat]
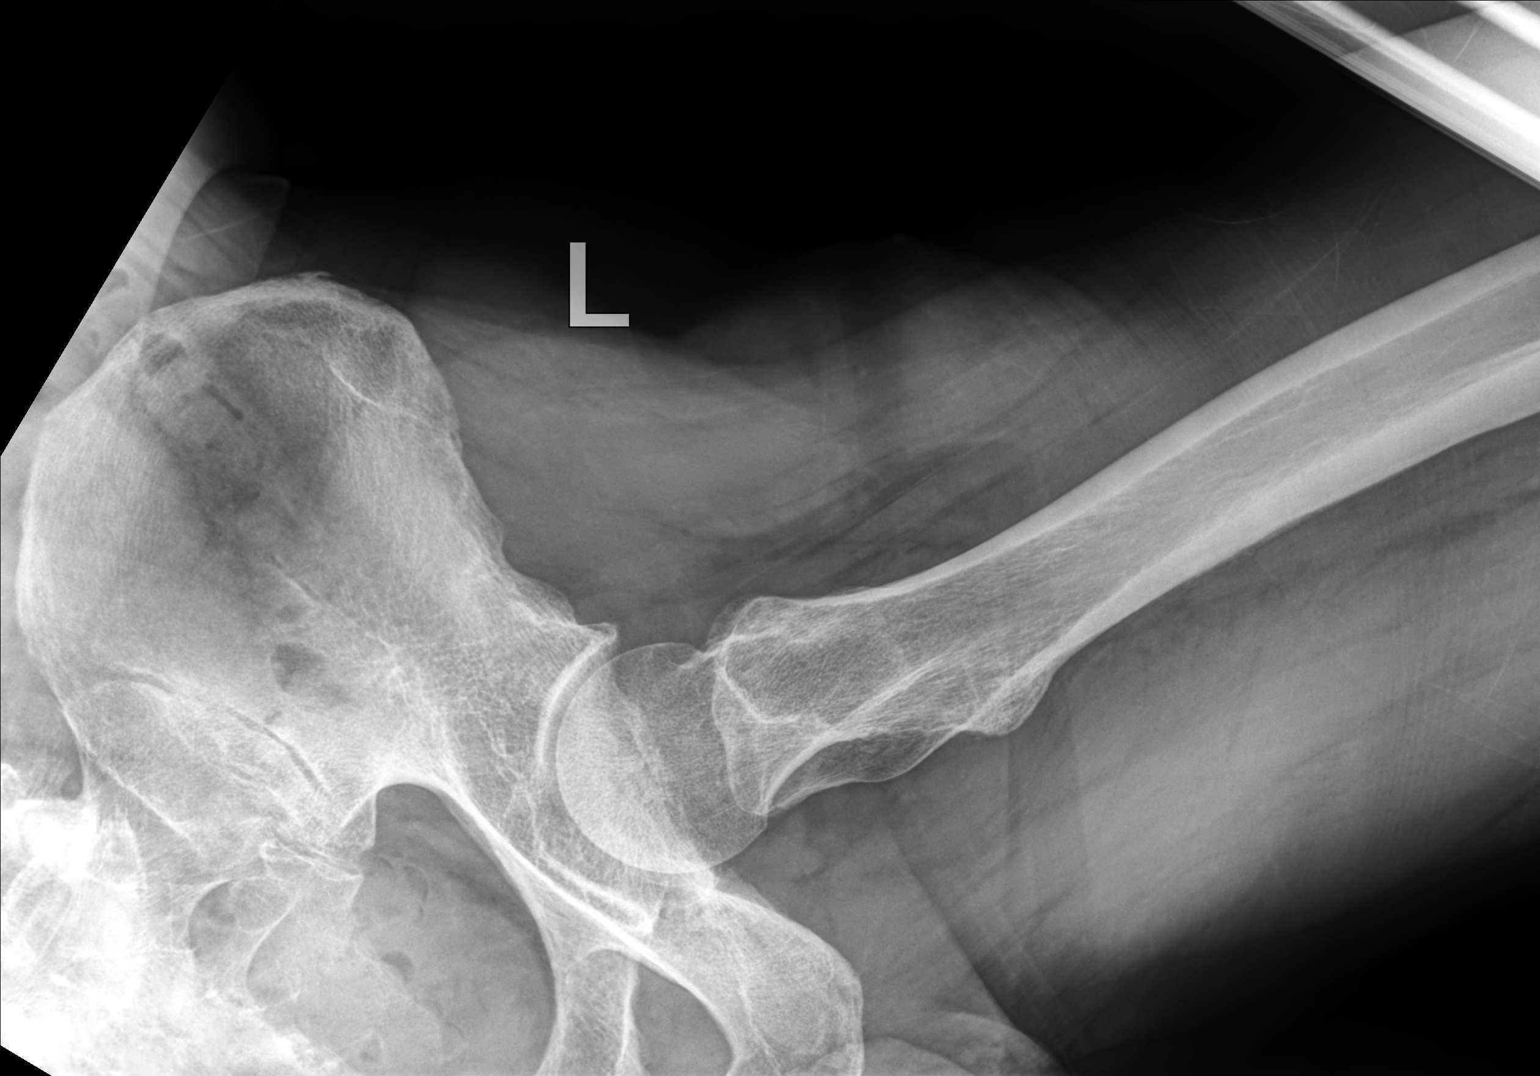

[2 of 2 positions shown; findings below may reference images not displayed]

FINDINGS: Bone mineralization is within normal limits for age. Femoral heads
normally located. Relatively normal for age hip joint spaces,
perhaps slightly reduced on the left. Intact proximal left femur.
Intact pelvis. Grossly intact proximal right femur. Both SI joints
appear normal. Negative visible sacrum and coccyx. Negative lower
abdominal and pelvic visceral contours.
IMPRESSION: No acute osseous abnormality identified. Mild asymmetric left hip
joint space loss.

## 2021-12-18 NOTE — Progress Notes (Unsigned)
[facility] [place] Phone: [phone] Subjective:    I'm seeing this patient by the request  of:  Farrel Conners, MD  CC: Back and neck pain follow-up  [id number]  Jacob [name]. is a 71 y.o. male coming in with complaint of back and neck pain. OMT 10/27/2021. Patient states that after a 1000 miles car trip he feels pretty good some mild tightness but nothing severe.  Patient does have some limited range of motion from time to time.  Notices that he can no walk couple rounds of golf a week without any significant difficulty.  Medications patient has been prescribed: None  Taking:         Reviewed prior external information including notes and imaging from previsou exam, outside providers and external EMR if available.   As well as notes that were available from care everywhere and other healthcare systems.  Past medical history, social, surgical and family history all reviewed in electronic medical record.  No pertanent information unless stated regarding to the chief complaint.   Past Medical History:  Diagnosis Date   BPH associated with nocturia    Hyperlipidemia     Allergies  Allergen Reactions   Other Other (See Comments)    Pet dander, tree mold, ragweed, pollen cause sneezing and drainage     Review of Systems:  No headache, visual changes, nausea, vomiting, diarrhea, constipation, dizziness, abdominal pain, skin rash, fevers, chills, night sweats, weight loss, swollen lymph nodes, body aches, joint swelling, chest pain, shortness of breath, mood changes. POSITIVE muscle aches  Objective  Pulse 82, height '5\' 10"'$  (1.778 m), weight 183 lb (83 kg), SpO2 99 %.   General: No apparent distress alert and oriented x3 mood and affect normal, dressed appropriately.  HEENT: Pupils equal, extraocular movements intact  Respiratory: Patient's speak in full sentences and does not appear short of breath   Cardiovascular: No lower extremity edema, non tender, no erythema  Gait MSK:  Back low back des have loss of lordosis  Tightness with FABER  Tightness in the neck as well   Osteopathic findings  C2 flexed rotated and side bent right C5 flexed rotated and side bent left T3 extended rotated and side bent right inhaled rib T7 extended rotated and side bent left L2 flexed rotated and side bent right Sacrum right on right     Assessment and Plan:  SI (sacroiliac) joint dysfunction Patient has been able to be very active overall.  Even walking the golf course on numerous occasions.  We discussed core strengthening still at this time and range of motion.  Discussed the over-the-counter medications on an as-needed basis.  We discussed watching out for any type of radicular symptoms.  Follow-up with me again in 6 to 8 weeks  DEGENERATIVE DISC DISEASE, CERVICAL SPINE, W/RADICULOPATHY Mild increase in tightness in the neck.  Will do some soft tissue in the area.  Discussed icing regimen and home exercises.  Discussed posture and ergonomics.  Follow-up again in 6 weeks has had meloxicam in the past of the 15 mg and can use as needed.    Nonallopathic problems  Decision today to treat with OMT was based on Physical Exam  After verbal consent patient was treated with HVLA, ME, FPR techniques in cervical, rib, thoracic, lumbar, and sacral  areas  Patient tolerated the procedure well with improvement in symptoms  Patient given exercises, stretches and lifestyle modifications  See medications  in patient instructions if given  Patient will follow up in 4-8 weeks    The above documentation has been reviewed and is accurate and complete [name], DO          Note: This dictation was prepared with Dragon dictation along with smaller phrase technology. Any transcriptional errors that result from this process are unintentional.

## 2021-12-22 ENCOUNTER — Ambulatory Visit (INDEPENDENT_AMBULATORY_CARE_PROVIDER_SITE_OTHER): Payer: Medicare Other | Admitting: Family Medicine

## 2021-12-22 ENCOUNTER — Encounter: Payer: Self-pay | Admitting: Gastroenterology

## 2021-12-22 VITALS — HR 82 | Ht 70.0 in | Wt 183.0 lb

## 2021-12-22 DIAGNOSIS — M9903 Segmental and somatic dysfunction of lumbar region: Secondary | ICD-10-CM

## 2021-12-22 DIAGNOSIS — M9901 Segmental and somatic dysfunction of cervical region: Secondary | ICD-10-CM

## 2021-12-22 DIAGNOSIS — M9904 Segmental and somatic dysfunction of sacral region: Secondary | ICD-10-CM | POA: Diagnosis not present

## 2021-12-22 DIAGNOSIS — M5020 Other cervical disc displacement, unspecified cervical region: Secondary | ICD-10-CM | POA: Diagnosis not present

## 2021-12-22 DIAGNOSIS — M533 Sacrococcygeal disorders, not elsewhere classified: Secondary | ICD-10-CM | POA: Diagnosis not present

## 2021-12-22 DIAGNOSIS — M9908 Segmental and somatic dysfunction of rib cage: Secondary | ICD-10-CM | POA: Diagnosis not present

## 2021-12-22 DIAGNOSIS — M9902 Segmental and somatic dysfunction of thoracic region: Secondary | ICD-10-CM | POA: Diagnosis not present

## 2021-12-22 NOTE — Assessment & Plan Note (Signed)
Mild increase in tightness in the neck.  Will do some soft tissue in the area.  Discussed icing regimen and home exercises.  Discussed posture and ergonomics.  Follow-up again in 6 weeks has had meloxicam in the past of the 15 mg and can use as needed.

## 2021-12-22 NOTE — Assessment & Plan Note (Signed)
Patient has been able to be very active overall.  Even walking the golf course on numerous occasions.  We discussed core strengthening still at this time and range of motion.  Discussed the over-the-counter medications on an as-needed basis.  We discussed watching out for any type of radicular symptoms.  Follow-up with me again in 6 to 8 weeks

## 2021-12-22 NOTE — Patient Instructions (Signed)
Good to see you! Happy Holiday! Follow-up 2 months.

## 2021-12-23 DIAGNOSIS — H2513 Age-related nuclear cataract, bilateral: Secondary | ICD-10-CM | POA: Diagnosis not present

## 2021-12-23 DIAGNOSIS — H25013 Cortical age-related cataract, bilateral: Secondary | ICD-10-CM | POA: Diagnosis not present

## 2021-12-23 DIAGNOSIS — H40033 Anatomical narrow angle, bilateral: Secondary | ICD-10-CM | POA: Diagnosis not present

## 2021-12-23 DIAGNOSIS — H40013 Open angle with borderline findings, low risk, bilateral: Secondary | ICD-10-CM | POA: Diagnosis not present

## 2022-02-08 ENCOUNTER — Ambulatory Visit (AMBULATORY_SURGERY_CENTER): Payer: Medicare HMO | Admitting: *Deleted

## 2022-02-08 VITALS — Ht 71.0 in | Wt 180.0 lb

## 2022-02-08 DIAGNOSIS — Z1211 Encounter for screening for malignant neoplasm of colon: Secondary | ICD-10-CM

## 2022-02-08 MED ORDER — NA SULFATE-K SULFATE-MG SULF 17.5-3.13-1.6 GM/177ML PO SOLN: 1.0000 | Freq: Once | ORAL | 0 refills | Status: AC

## 2022-02-08 NOTE — Progress Notes (Signed)
No egg or soy allergy known to patient  No issues known to pt with past sedation with any surgeries or procedures Patient denies ever being told they had issues or difficulty with intubation  No FH of Malignant Hyperthermia Pt is not on diet pills Pt is not on  home 02  Pt is not on blood thinners  Pt denies issues with constipation  No A fib or A flutter Have any cardiac testing pending--NO Pt instructed to use [employer] or GoodRx for a price reduction on prep    Obtained instructions via my chart.  Patient's chart reviewed by Osvaldo Angst CNRA prior to previsit and patient appropriate for the [facility].  Previsit completed and red dot placed by patient's name on their procedure day (on provider's schedule).

## 2022-02-09 NOTE — Progress Notes (Unsigned)
[name] [address] [place] Phone: [phone] Subjective:   Jacob [name], am serving as a scribe for Dr. Hulan Saas.  I'm seeing this patient by the request  of:  Farrel Conners, MD  CC: back and neck pain   [id number]  Jacob [name]. is a 72 y.o. male coming in with complaint of back and neck pain. OMT 12/22/2021. Patient states here for routine OMT. Does want to talk some more about his ankles. Wants a refill of the Meloxicam   Medications patient has been prescribed: None  Taking:         Reviewed prior external information including notes and imaging from previsou exam, outside providers and external EMR if available.   As well as notes that were available from care everywhere and other healthcare systems.  Past medical history, social, surgical and family history all reviewed in electronic medical record.  No pertanent information unless stated regarding to the chief complaint.   Past Medical History:  Diagnosis Date   Allergy    SEASONAL   Arthritis    SHOULDERS   BPH associated with nocturia    Hyperlipidemia    "NOT ON MEDICATIONS"    Allergies  Allergen Reactions   Other Other (See Comments)    Pet dander, tree mold, ragweed, pollen cause sneezing and drainage     Review of Systems:  No headache, visual changes, nausea, vomiting, diarrhea, constipation, dizziness, abdominal pain, skin rash, fevers, chills, night sweats, weight loss, swollen lymph nodes, body aches, joint swelling, chest pain, shortness of breath, mood changes. POSITIVE muscle aches  Objective  Blood pressure 124/84, pulse 71, height '5\' 11"'$  (1.803 m), weight 174 lb (78.9 kg), SpO2 98 %.   General: No apparent distress alert and oriented x3 mood and affect normal, dressed appropriately.  HEENT: Pupils equal, extraocular movements intact  Respiratory: Patient's speak in full sentences and does not appear short of  breath  Cardiovascular: No lower extremity edema, non tender, no erythema  Gait overall normal MSK:  Back tender to palpation in the paraspinal musculature.  Seems to be on the right sacroiliac joint also noted.  Patient has severe discomfort with any type of extension of the back.  Negative straight leg test noted.  Osteopathic findings  C2 flexed rotated and side bent right C7 flexed rotated and side bent right T3 extended rotated and side bent right inhaled rib T9 extended rotated and side bent left L2 flexed rotated and side bent right Sacrum right on right       Assessment and Plan:  Left ankle pain Patient does have some mild arthritic changes with a pes planus noted.  Discussed the potential for custom orthotics.  Patient wants to avoid that at the moment.  Discussed over-the-counter orthotics that I think will be helpful.  We discussed turf shoes with golfing.  Follow-up again in 6 to 8 weeks otherwise.  Refilled patient's meloxicam 15 mg daily as well.    Nonallopathic problems  Decision today to treat with OMT was based on Physical Exam  After verbal consent patient was treated with HVLA, ME, FPR techniques in cervical, rib, thoracic, lumbar, and sacral  areas  Patient tolerated the procedure well with improvement in symptoms  Patient given exercises, stretches and lifestyle modifications  See medications in patient instructions if given  Patient will follow up in 4-8 weeks    The above documentation has been reviewed and is accurate and  complete [name], DO          Note: This dictation was prepared with Dragon dictation along with smaller phrase technology. Any transcriptional errors that result from this process are unintentional.

## 2022-02-15 ENCOUNTER — Ambulatory Visit (INDEPENDENT_AMBULATORY_CARE_PROVIDER_SITE_OTHER): Payer: Medicare HMO | Admitting: Family Medicine

## 2022-02-15 VITALS — BP 124/84 | HR 71 | Ht 71.0 in | Wt 174.0 lb

## 2022-02-15 DIAGNOSIS — M9908 Segmental and somatic dysfunction of rib cage: Secondary | ICD-10-CM

## 2022-02-15 DIAGNOSIS — M9901 Segmental and somatic dysfunction of cervical region: Secondary | ICD-10-CM

## 2022-02-15 DIAGNOSIS — M9902 Segmental and somatic dysfunction of thoracic region: Secondary | ICD-10-CM | POA: Diagnosis not present

## 2022-02-15 DIAGNOSIS — M533 Sacrococcygeal disorders, not elsewhere classified: Secondary | ICD-10-CM | POA: Diagnosis not present

## 2022-02-15 DIAGNOSIS — M9904 Segmental and somatic dysfunction of sacral region: Secondary | ICD-10-CM | POA: Diagnosis not present

## 2022-02-15 DIAGNOSIS — M25572 Pain in left ankle and joints of left foot: Secondary | ICD-10-CM | POA: Diagnosis not present

## 2022-02-15 DIAGNOSIS — M9903 Segmental and somatic dysfunction of lumbar region: Secondary | ICD-10-CM

## 2022-02-15 MED ORDER — MELOXICAM 15 MG PO TABS: 15.0000 mg | ORAL_TABLET | Freq: Every day | ORAL | 0 refills | Status: DC

## 2022-02-15 NOTE — Assessment & Plan Note (Addendum)
Patient does have some mild arthritic changes with a pes planus noted.  Discussed the potential for custom orthotics.  Patient wants to avoid that at the moment.  Discussed over-the-counter orthotics that I think will be helpful.  We discussed turf shoes with golfing.  Follow-up again in 6 to 8 weeks otherwise.  Refilled patient's meloxicam 15 mg daily as well.

## 2022-02-15 NOTE — Patient Instructions (Addendum)
Good to see you  Refill of the Meloxicam sent to pharmacy  No more pressing over the head [facility] orthotics Follow up in 7-8 weeks

## 2022-02-15 NOTE — Assessment & Plan Note (Signed)
Chronic problem with mild exacerbation.  Some increasing tightness noted.  Discussed the potential alignment of the ankle and foot that should be helpful.  Discussed which activities to do and which ones to avoid.  Increase activity slowly.  Follow-up again in 6 to 8 weeks meloxicam 15 mg for breakthrough pain.

## 2022-02-23 ENCOUNTER — Encounter: Payer: Self-pay | Admitting: Gastroenterology

## 2022-03-01 ENCOUNTER — Ambulatory Visit: Payer: Medicare HMO | Admitting: Gastroenterology

## 2022-03-01 ENCOUNTER — Encounter: Payer: Self-pay | Admitting: Gastroenterology

## 2022-03-01 ENCOUNTER — Telehealth: Payer: Self-pay | Admitting: Family Medicine

## 2022-03-01 VITALS — BP 120/71 | HR 54 | Temp 97.5°F | Resp 13 | Ht 71.0 in | Wt 180.0 lb

## 2022-03-01 DIAGNOSIS — Z1211 Encounter for screening for malignant neoplasm of colon: Secondary | ICD-10-CM | POA: Diagnosis present

## 2022-03-01 DIAGNOSIS — D125 Benign neoplasm of sigmoid colon: Secondary | ICD-10-CM

## 2022-03-01 DIAGNOSIS — D122 Benign neoplasm of ascending colon: Secondary | ICD-10-CM

## 2022-03-01 DIAGNOSIS — D123 Benign neoplasm of transverse colon: Secondary | ICD-10-CM | POA: Diagnosis not present

## 2022-03-01 MED ORDER — SODIUM CHLORIDE 0.9 % IV SOLN: 500.0000 mL | Freq: Once | INTRAVENOUS | Status: DC

## 2022-03-01 NOTE — Op Note (Signed)
[facility] Patient Name: Jacob [name] Procedure Date: 03/01/2022 7:56 AM MRN: [id number] Endoscopist: Nicki Reaper E. Candis Schatz , MD, [id number] Age: 72 Referring MD:  Date of Birth: 24-Feb-1950 Gender: Male Account #: [id number] Procedure:                Colonoscopy Indications:              Screening for colorectal malignant neoplasm (last                            colonoscopy was 10 years ago) Medicines:                Monitored Anesthesia Care Procedure:                Pre-Anesthesia Assessment:                           - Prior to the procedure, a History and Physical                            was performed, and patient medications and                            allergies were reviewed. The patient's tolerance of                            previous anesthesia was also reviewed. The risks                            and benefits of the procedure and the sedation                            options and risks were discussed with the patient.                            All questions were answered, and informed consent                            was obtained. Prior Anticoagulants: The patient has                            taken no anticoagulant or antiplatelet agents. ASA                            Grade Assessment: II - A patient with mild systemic                            disease. After reviewing the risks and benefits,                            the patient was deemed in satisfactory condition to                            undergo the procedure.  After obtaining informed consent, the colonoscope                            was passed under direct vision. Throughout the                            procedure, the patient's blood pressure, pulse, and                            oxygen saturations were monitored continuously. The                            Colonoscope was introduced through the anus and                            advanced to the the terminal  ileum, with                            identification of the appendiceal orifice and IC                            valve. The colonoscopy was performed without                            difficulty. The patient tolerated the procedure                            well. The quality of the bowel preparation was                            adequate. The terminal ileum, ileocecal valve,                            appendiceal orifice, and rectum were photographed.                            The bowel preparation used was SUPREP via split                            dose instruction. Scope In: 8:03:44 AM Scope Out: 8:33:57 AM Scope Withdrawal Time: 0 hours 23 minutes 4 seconds  Total Procedure Duration: 0 hours 30 minutes 13 seconds  Findings:                 The perianal and digital rectal examinations were                            normal. Pertinent negatives include normal                            sphincter tone and no palpable rectal lesions.                           Multiple large-mouthed and small-mouthed  diverticula were found in the sigmoid colon,                            descending colon, transverse colon and ascending                            colon. There was narrowing of the colon in                            association with the diverticular opening. There                            was evidence of an impacted diverticulum. There was                            no evidence of diverticular bleeding.                           A 4 mm polyp was found in the ascending colon. The                            polyp was sessile. The polyp was removed with a                            cold snare. Resection and retrieval were complete.                            Estimated blood loss was minimal.                           Four sessile polyps were found in the transverse                            colon. The polyps were 2 to 4 mm in size. These                             polyps were removed with a cold snare. Resection                            and retrieval were complete. Estimated blood loss                            was minimal.                           A 2 mm polyp was found in the transverse colon. The                            polyp was sessile. The polyp was removed with a                            cold biopsy forceps. Resection and retrieval were  complete. Estimated blood loss was minimal.                           A 4 mm polyp was found in the sigmoid colon. The                            polyp was sessile. The polyp was removed with a                            cold snare. Resection and retrieval were complete.                            Estimated blood loss was minimal.                           The exam was otherwise normal throughout the                            examined colon.                           The terminal ileum appeared normal.                           Non-bleeding internal hemorrhoids were found during                            retroflexion. The hemorrhoids were Grade I                            (internal hemorrhoids that do not prolapse).                           No additional abnormalities were found on                            retroflexion. Complications:            No immediate complications. Estimated Blood Loss:     Estimated blood loss was minimal. Impression:               - Severe diverticulosis in the sigmoid colon, in                            the descending colon, in the transverse colon and                            in the ascending colon. There was narrowing of the                            colon in association with the diverticular opening.                            There was evidence of an impacted diverticulum.  There was no evidence of diverticular bleeding.                           - One 4 mm polyp in the ascending colon, removed                             with a cold snare. Resected and retrieved.                           - Four 2 to 4 mm polyps in the transverse colon,                            removed with a cold snare. Resected and retrieved.                           - One 2 mm polyp in the transverse colon, removed                            with a cold biopsy forceps. Resected and retrieved.                           - One 4 mm polyp in the sigmoid colon, removed with                            a cold snare. Resected and retrieved.                           - The examined portion of the ileum was normal.                           - Non-bleeding internal hemorrhoids. Recommendation:           - Patient has a contact number available for                            emergencies. The signs and symptoms of potential                            delayed complications were discussed with the                            patient. Return to normal activities tomorrow.                            Written discharge instructions were provided to the                            patient.                           - Resume previous diet.                           - Continue present medications.                           -  Await pathology results.                           - Repeat colonoscopy (date not yet determined) for                            surveillance based on pathology results. Lane Eland E. Candis Schatz, MD 03/01/2022 8:46:36 AM This report has been signed electronically.

## 2022-03-01 NOTE — Telephone Encounter (Signed)
Spoke with patient ot schedule Medicare Annual Wellness Visit (AWV) either virtually or in office.  my Jacob [name] number [phone]  Patient requested a call back 03/02/22 to schedule   Last AWV 01/08/21 please schedule with Nurse Health Adviser   45 min for awv-i  in office appointments 30 min for awv-s & awv-i phone/virtual appointments

## 2022-03-01 NOTE — Progress Notes (Signed)
Pt's states no medical or surgical changes since previsit or office visit. 

## 2022-03-01 NOTE — Progress Notes (Signed)
Report to pacu rn. Vss. Care resumed by rn. 

## 2022-03-01 NOTE — Progress Notes (Signed)
Called to room to assist during endoscopic procedure.  Patient ID and intended procedure confirmed with present staff. Received instructions for my participation in the procedure from the performing physician.  

## 2022-03-01 NOTE — Patient Instructions (Addendum)
Recommendation: Patient has a contact number available for                            emergencies. The signs and symptoms of potential                            delayed complications were discussed with the                            patient. Return to normal activities tomorrow.                            Written discharge instructions were provided to the                            patient.                           - Resume previous diet.                           - Continue present medications.                           - Await pathology results.                           - Repeat colonoscopy (date not yet determined) for                            surveillance based on pathology results.  YOU HAD AN ENDOSCOPIC PROCEDURE TODAY AT [facility]:   Refer to the procedure report that was given to you for any specific questions about what was found during the examination.  If the procedure report does not answer your questions, please call your gastroenterologist to clarify.  If you requested that your care partner not be given the details of your procedure findings, then the procedure report has been included in a sealed envelope for you to review at your convenience later.  Handouts on hemorrhoids, polyps and diverticulosis given.   YOU SHOULD EXPECT: Some feelings of bloating in the abdomen. Passage of more gas than usual.  Walking can help get rid of the air that was put into your GI tract during the procedure and reduce the bloating. If you had a lower endoscopy (such as a colonoscopy or flexible sigmoidoscopy) you may notice spotting of blood in your stool or on the toilet paper. If you underwent a bowel prep for your procedure, you may not have a normal bowel movement for a few days.  Please Note:  You might notice some irritation and congestion in your nose or some drainage.  This is from the oxygen used during your procedure.  There is no need for concern and it should  clear up in a day or so.  SYMPTOMS TO REPORT IMMEDIATELY:  Following lower endoscopy (colonoscopy or flexible sigmoidoscopy):  Excessive amounts of blood in the stool  Significant tenderness or worsening of abdominal pains  Swelling of the abdomen that is new, acute  Fever of 100F or higher  For urgent or emergent issues, a gastroenterologist can be reached at any hour by calling [phone]. Do not use MyChart messaging for urgent concerns.   DIET:  We do recommend a small meal at first, but then you may proceed to your regular diet.  Drink plenty of fluids but you should avoid alcoholic beverages for 24 hours.  ACTIVITY:  You should plan to take it easy for the rest of today and you should NOT DRIVE or use heavy machinery until tomorrow (because of the sedation medicines used during the test).    FOLLOW UP: Our staff will call the number listed on your records the next business day following your procedure.  We will call around 7:15- 8:00 am to check on you and address any questions or concerns that you may have regarding the information given to you following your procedure. If we do not reach you, we will leave a message.     If any biopsies were taken you will be contacted by phone or by letter within the next 1-3 weeks.  Please call us at [phone] if you have not heard about the biopsies in 3 weeks.   SIGNATURES/CONFIDENTIALITY: You and/or your care partner have signed paperwork which will be entered into your electronic medical record.  These signatures attest to the fact that that the information above on your After Visit Summary has been reviewed and is understood.  Full responsibility of the confidentiality of this discharge information lies with you and/or your care-partner.

## 2022-03-01 NOTE — Progress Notes (Signed)
[facility] History and Physical   Primary Care Physician:  Farrel Conners, MD   Reason for Procedure:   Colon cancer screening  Plan:    Screening colonoscopy     HPI: Aleric Froelich. is a 72 y.o. male undergoing average risk screening colonoscopy.  He has no family history of colon cancer and no chronic GI symptoms. He had two prior normal colonoscopies (per patient, reports not available)   Past Medical History:  Diagnosis Date   Allergy    SEASONAL   Arthritis    SHOULDERS   BPH associated with nocturia    Hyperlipidemia    "NOT ON MEDICATIONS"    Past Surgical History:  Procedure Laterality Date   APPENDECTOMY  1973   COLONOSCOPY     KNEE ARTHROSCOPY Left    x2   ROTATOR CUFF REPAIR Bilateral     Prior to Admission medications   Medication Sig Start Date End Date Taking? Authorizing Provider  Cyanocobalamin (B-12 PO) Take 1,000 mcg by mouth 3 (three) times a week.   Yes [provider]  Fluticasone Propionate (FLONASE ALLERGY RELIEF NA) Place into the nose as needed.   Yes [provider]  meloxicam (MOBIC) 15 MG tablet Take 1 tablet (15 mg total) by mouth daily. 02/15/22  Yes [name], DO  Multiple Vitamin (MULTIVITAMIN ADULT PO) Take by mouth daily.   Yes [provider]  Multiple Vitamins-Minerals (OCUVITE PO) Take by mouth daily.   Yes [provider]  Probiotic Product (PROBIOTIC PO) Take by mouth daily.   Yes [provider]  Cetirizine HCl (ZYRTEC ALLERGY PO) Take by mouth daily.    [provider]    Current Outpatient Medications  Medication Sig Dispense Refill   Cyanocobalamin (B-12 PO) Take 1,000 mcg by mouth 3 (three) times a week.     Fluticasone Propionate (FLONASE ALLERGY RELIEF NA) Place into the nose as needed.     meloxicam (MOBIC) 15 MG tablet Take 1 tablet (15 mg total) by mouth daily. 30 tablet 0   Multiple Vitamin (MULTIVITAMIN ADULT PO) Take by mouth daily.      Multiple Vitamins-Minerals (OCUVITE PO) Take by mouth daily.     Probiotic Product (PROBIOTIC PO) Take by mouth daily.     Cetirizine HCl (ZYRTEC ALLERGY PO) Take by mouth daily.     Current Facility-Administered Medications  Medication Dose Route Frequency Provider Last Rate Last Admin   0.9 %  sodium chloride infusion  500 mL Intravenous Once Daryel November, MD        Allergies as of 03/01/2022 - Review Complete 03/01/2022  Allergen Reaction Noted   Other Other (See Comments) 12/06/2018    Family History  Problem Relation Age of Onset   Sarcoidosis Mother        lung   Colon polyps Father    Heart failure Father 66   Rheum arthritis Father    Gout Brother    Heart attack Maternal Grandfather 33   Other Paternal Grandfather 96   Diabetes Neg Hx    Colon cancer Neg Hx    Crohn's disease Neg Hx    Esophageal cancer Neg Hx    Rectal cancer Neg Hx    Stomach cancer Neg Hx    Ulcerative colitis Neg Hx     Social History   Socioeconomic History   Marital status: Married    Spouse name: Not on file   Number of children: Not on file  Years of education: Not on file   Highest education level: Not on file  Occupational History   Not on file  Tobacco Use   Smoking status: Former    Types: Cigarettes    Quit date: 1971    Years since quitting: 53.1   Smokeless tobacco: Never  Vaping Use   Vaping Use: Never used  Substance and Sexual Activity   Alcohol use: Yes    Alcohol/week: 7.0 standard drinks of alcohol    Types: 7 Standard drinks or equivalent per week    Comment: 2 glasses wine nightly; 4 shot liquor   Drug use: No   Sexual activity: Not on file  Other Topics Concern   Not on file  Social History Narrative   Not on file   Social Determinants of Health   Financial Resource Strain: Low Risk  (01/08/2021)   Overall Financial Resource Strain (CARDIA)    Difficulty of Paying Living Expenses: Not hard at all  Food Insecurity: No Food Insecurity  (01/08/2021)   Hunger Vital Sign    Worried About Running Out of Food in the Last Year: Never true    Ran Out of Food in the Last Year: Never true  Transportation Needs: No Transportation Needs (01/08/2021)   PRAPARE - Hydrologist (Medical): No    Lack of Transportation (Non-Medical): No  Physical Activity: Sufficiently Active (01/08/2021)   Exercise Vital Sign    Days of Exercise per Week: 7 days    Minutes of Exercise per Session: 30 min  Stress: No Stress Concern Present (01/07/2020)   [place]    Feeling of Stress : Not at all  Social Connections: [place] (01/08/2021)   Social Connection and Isolation Panel [NHANES]    Frequency of Communication with Friends and Family: More than three times a week    Frequency of Social Gatherings with Friends and Family: More than three times a week    Attends Religious Services: More than 4 times per year    Active Member of [employer] or Organizations: Yes    Attends Music therapist: More than 4 times per year    Marital Status: Married  Human resources officer Violence: Not At Risk (01/08/2021)   Humiliation, Afraid, Rape, and Kick questionnaire    Fear of Current or Ex-Partner: No    Emotionally Abused: No    Physically Abused: No    Sexually Abused: No    Review of Systems:  All other review of systems negative except as mentioned in the HPI.  Physical Exam: Vital signs BP 124/70   Pulse 67   Temp (!) 97.5 F (36.4 C)   Ht '5\' 11"'$  (1.803 m)   Wt 180 lb (81.6 kg)   SpO2 98%   BMI 25.10 kg/m   General:   Alert,  Well-developed, well-nourished, pleasant and cooperative in NAD Airway:  Mallampati 1 Lungs:  Clear throughout to auscultation.   Heart:  Regular rate and rhythm; no murmurs, clicks, rubs,  or gallops. Abdomen:  Soft, nontender and nondistended. Normal bowel sounds.   Neuro/Psych:  Normal mood and affect. A and O  x 3   Zaniel Marineau E. Candis Schatz, MD [facility]

## 2022-03-02 ENCOUNTER — Telehealth: Payer: Self-pay | Admitting: *Deleted

## 2022-03-02 NOTE — Telephone Encounter (Signed)
  Follow up Call-     03/01/2022    7:13 AM  Call back number  Post procedure Call Back phone  # [phone]  Permission to leave phone message Yes     Patient questions:  Do you have a fever, pain , or abdominal swelling? No. Pain Score  0 *  Have you tolerated food without any problems? Yes.    Have you been able to return to your normal activities? Yes.    Do you have any questions about your discharge instructions: Diet   No. Medications  No. Follow up visit  No.  Do you have questions or concerns about your Care? No.  Actions: * If pain score is 4 or above: No action needed, pain <4.

## 2022-03-04 NOTE — Progress Notes (Signed)
Mr. Jacob [name],  All seven polyps that I removed during your recent procedure were completely benign but were proven to be "pre-cancerous" polyps that MAY have grown into cancers if they had not been removed.  Studies shows that at least 20% of women over age 74 and 30% of men over age 42 have pre-cancerous polyps.  Based on current nationally recognized surveillance guidelines, I recommend that you have a repeat colonoscopy in 3 years.   If you develop any new rectal bleeding, abdominal pain or significant bowel habit changes, please contact me before then.

## 2022-03-23 ENCOUNTER — Telehealth: Payer: Medicare HMO | Admitting: Family Medicine

## 2022-04-07 NOTE — Progress Notes (Unsigned)
  Jacob [name] Phone: [phone] Subjective:   Jacob [name], am serving as a scribe for Dr. Hulan Saas.  I'm seeing this patient by the request  of:  Farrel Conners, MD  CC: Low back pain follow-up  [id number]  Jacob [name]. is a 72 y.o. male coming in with complaint of back and neck pain. OMT 02/15/2022. Patient states that   Medications patient has been prescribed: Meloxicam  Taking:         Reviewed prior external information including notes and imaging from previsou exam, outside providers and external EMR if available.   As well as notes that were available from care everywhere and other healthcare systems.  Past medical history, social, surgical and family history all reviewed in electronic medical record.  [name] pertanent information unless stated regarding to the chief complaint.   Past Medical History:  Diagnosis Date   Allergy    SEASONAL   Arthritis    SHOULDERS   BPH associated with nocturia    Hyperlipidemia    "NOT ON MEDICATIONS"    Allergies  Allergen Reactions   Other Other (See Comments)    Pet dander, tree mold, ragweed, pollen cause sneezing and drainage     Review of Systems:  [name] headache, visual changes, nausea, vomiting, diarrhea, constipation, dizziness, abdominal pain, skin rash, fevers, chills, night sweats, weight loss, swollen lymph nodes, body aches, joint swelling, chest pain, shortness of breath, mood changes. POSITIVE muscle aches  Objective  Blood pressure 124/88, pulse 77, height '5\' 11"'$  (1.803 m), weight 187 lb (84.8 kg), SpO2 97 %.   General: [name] apparent distress alert and oriented x3 mood and affect normal, dressed appropriately.  HEENT: Pupils equal, extraocular movements intact  Respiratory: Patient's speak in full sentences and does not appear short of breath  Cardiovascular: [name] lower extremity edema, non tender, [name] erythema  Low back does  have some loss of lordosis.  There is still tenderness over the sacroiliac joint and seems to be bilaterally.  Tender to palpation is noted.  Osteopathic findings  T9 extended rotated and side bent left L3 flexed rotated and side bent right Sacrum right on right     Assessment and Plan:  SI (sacroiliac) joint dysfunction Chronic problem with some exacerbation secondary to patient been driving more frequently.  Patient is noncompliant on having a regular workout routine that could also be one of the concerns of why patient is having more discomfort as well.  Patient will continue to work on core strengthening, hip abductor strengthening as well, icing regimen.  Increase activity 6 weeks    Nonallopathic problems  Decision today to treat with OMT was based on Physical Exam  After verbal consent patient was treated with HVLA, ME, FPR techniques in cervical, rib, thoracic, lumbar, and sacral  areas  Patient tolerated the procedure well with improvement in symptoms  Patient given exercises, stretches and lifestyle modifications  See medications in patient instructions if given  Patient will follow up in 4-8 weeks    The above documentation has been reviewed and is accurate and complete Jacob Pulley, DO          Note: This dictation was prepared with Dragon dictation along with smaller phrase technology. Any transcriptional errors that result from this process are unintentional.

## 2022-04-08 ENCOUNTER — Ambulatory Visit: Payer: Medicare HMO | Admitting: Family Medicine

## 2022-04-08 ENCOUNTER — Encounter: Payer: Self-pay | Admitting: Family Medicine

## 2022-04-08 VITALS — BP 124/88 | HR 77 | Ht 71.0 in | Wt 187.0 lb

## 2022-04-08 DIAGNOSIS — M9902 Segmental and somatic dysfunction of thoracic region: Secondary | ICD-10-CM

## 2022-04-08 DIAGNOSIS — M9903 Segmental and somatic dysfunction of lumbar region: Secondary | ICD-10-CM

## 2022-04-08 DIAGNOSIS — M533 Sacrococcygeal disorders, not elsewhere classified: Secondary | ICD-10-CM | POA: Diagnosis not present

## 2022-04-08 DIAGNOSIS — M9904 Segmental and somatic dysfunction of sacral region: Secondary | ICD-10-CM

## 2022-04-08 NOTE — Patient Instructions (Signed)
Good to see you Keep up with stretching Pass contact info along Drink more water See me again in 7-8 weeks

## 2022-04-08 NOTE — Assessment & Plan Note (Signed)
Chronic problem with some exacerbation secondary to patient been driving more frequently.  Patient is noncompliant on having a regular workout routine that could also be one of the concerns of why patient is having more discomfort as well.  Patient will continue to work on core strengthening, hip abductor strengthening as well, icing regimen.  Increase activity 6 weeks

## 2022-04-12 ENCOUNTER — Encounter: Payer: Self-pay | Admitting: Family Medicine

## 2022-04-15 ENCOUNTER — Ambulatory Visit (INDEPENDENT_AMBULATORY_CARE_PROVIDER_SITE_OTHER): Payer: Medicare HMO | Admitting: Family Medicine

## 2022-04-15 ENCOUNTER — Encounter: Payer: Self-pay | Admitting: Family Medicine

## 2022-04-15 VITALS — BP 120/84 | HR 67 | Temp 97.8°F | Ht 71.0 in | Wt 185.0 lb

## 2022-04-15 DIAGNOSIS — R252 Cramp and spasm: Secondary | ICD-10-CM

## 2022-04-15 DIAGNOSIS — Z13220 Encounter for screening for lipoid disorders: Secondary | ICD-10-CM

## 2022-04-15 DIAGNOSIS — E538 Deficiency of other specified B group vitamins: Secondary | ICD-10-CM

## 2022-04-15 DIAGNOSIS — N401 Enlarged prostate with lower urinary tract symptoms: Secondary | ICD-10-CM | POA: Diagnosis not present

## 2022-04-15 DIAGNOSIS — Z0000 Encounter for general adult medical examination without abnormal findings: Secondary | ICD-10-CM

## 2022-04-15 DIAGNOSIS — R351 Nocturia: Secondary | ICD-10-CM

## 2022-04-15 LAB — CBC WITH DIFFERENTIAL/PLATELET
Basophils Absolute: 0.1 10*3/uL (ref 0.0–0.1)
Basophils Relative: 1.2 % (ref 0.0–3.0)
Eosinophils Absolute: 0.1 10*3/uL (ref 0.0–0.7)
Eosinophils Relative: 1.3 % (ref 0.0–5.0)
HCT: 44.2 % (ref 39.0–52.0)
Hemoglobin: 15.1 g/dL (ref 13.0–17.0)
Lymphocytes Relative: 29.2 % (ref 12.0–46.0)
Lymphs Abs: 1.7 10*3/uL (ref 0.7–4.0)
MCHC: 34.1 g/dL (ref 30.0–36.0)
MCV: 92.8 fl (ref 78.0–100.0)
Monocytes Absolute: 0.6 10*3/uL (ref 0.1–1.0)
Monocytes Relative: 9.3 % (ref 3.0–12.0)
Neutro Abs: 3.5 10*3/uL (ref 1.4–7.7)
Neutrophils Relative %: 59 % (ref 43.0–77.0)
Platelets: 234 10*3/uL (ref 150.0–400.0)
RBC: 4.76 Mil/uL (ref 4.22–5.81)
RDW: 14.2 % (ref 11.5–15.5)
WBC: 5.9 10*3/uL (ref 4.0–10.5)

## 2022-04-15 LAB — COMPREHENSIVE METABOLIC PANEL
ALT: 14 U/L (ref 0–53)
AST: 17 U/L (ref 0–37)
Albumin: 4.5 g/dL (ref 3.5–5.2)
Alkaline Phosphatase: 42 U/L (ref 39–117)
BUN: 19 mg/dL (ref 6–23)
CO2: 28 mEq/L (ref 19–32)
Calcium: 9.8 mg/dL (ref 8.4–10.5)
Chloride: 104 mEq/L (ref 96–112)
Creatinine, Ser: 1.11 mg/dL (ref 0.40–1.50)
GFR: 65.84 mL/min (ref >60.00)
Glucose, Bld: 92 mg/dL (ref 70–99)
Potassium: 4.4 mEq/L (ref 3.5–5.1)
Sodium: 140 mEq/L (ref 135–145)
Total Bilirubin: 0.6 mg/dL (ref 0.2–1.2)
Total Protein: 7.1 g/dL (ref 6.0–8.3)

## 2022-04-15 LAB — LIPID PANEL
Cholesterol: 207 mg/dL — ABNORMAL HIGH (ref 0–200)
HDL: 76 mg/dL (ref >39.00)
LDL Cholesterol: 119 mg/dL — ABNORMAL HIGH (ref 0–99)
NonHDL: 130.7
Total CHOL/HDL Ratio: 3
Triglycerides: 57 mg/dL (ref 0.0–149.0)
VLDL: 11.4 mg/dL (ref 0.0–40.0)

## 2022-04-15 LAB — MAGNESIUM: Magnesium: 2.4 mg/dL (ref 1.5–2.5)

## 2022-04-15 LAB — PSA: PSA: 1.58 ng/mL (ref 0.10–4.00)

## 2022-04-15 LAB — VITAMIN B12: Vitamin B-12: 363 pg/mL (ref 211–911)

## 2022-04-15 NOTE — Progress Notes (Signed)
Complete physical exam  Patient: Jacob [name].   DOB: 07-06-1950   72 y.o. Male  MRN: [id number]  Subjective:    Chief Complaint  Patient presents with   Annual Exam    Jacob [name]. is a 72 y.o. male who presents today for a complete physical exam. He reports consuming a  mediterranean diet  diet. Gym/ health club routine includes cardio, light weights, and stretching. He generally feels well. He reports sleeping well. He does not have additional problems to discuss today.    Most recent fall risk assessment:    04/15/2022    9:30 AM  Fall Risk   Falls in the past year? 0  Number falls in past yr: 0  Injury with Fall? 0  Risk for fall due to : No Fall Risks  Follow up Falls evaluation completed     Most recent depression screenings:    04/15/2022    9:31 AM 11/19/2021    8:58 AM  PHQ 2/9 Scores  PHQ - 2 Score 0 0  PHQ- 9 Score 1 0    Dental: No current dental problems and Receives regular dental care  Patient Active Problem List   Diagnosis Date Noted   B12 deficiency 11/19/2021   Right foot pain 10/27/2021   Left ankle pain 10/09/2020   Seasonal allergies 06/18/2020   SI (sacroiliac) joint dysfunction 02/26/2019   Nonallopathic lesion of sacral region 02/26/2019   Nonallopathic lesion of lumbosacral region 02/26/2019   Nonallopathic lesion of thoracic region 02/26/2019   BPH associated with nocturia 11/05/2014   DEGENERATIVE [place] DISEASE, CERVICAL SPINE, 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   Cyanocobalamin (B-12 PO) Take 1,000 mcg by mouth 3 (three) times a week.   Fluticasone Propionate  (FLONASE ALLERGY RELIEF NA) Place into the nose as needed.   meloxicam (MOBIC) 15 MG tablet Take 1 tablet (15 mg total) by mouth daily.   Multiple Vitamin (MULTIVITAMIN ADULT PO) Take by mouth daily.   Multiple Vitamins-Minerals (OCUVITE PO) Take by mouth daily.   Probiotic Product (PROBIOTIC PO) Take by mouth daily.   No facility-administered medications prior to visit.    Review of Systems  HENT:  Negative for hearing loss.   Eyes:  Negative for blurred vision.  Respiratory:  Negative for shortness of breath.   Cardiovascular:  Negative for chest pain.  Gastrointestinal: Negative.   Genitourinary: Negative.   Musculoskeletal:  Negative for back pain.  Neurological:  Negative for headaches.  Psychiatric/Behavioral:  Negative for depression.           Objective:     BP 120/84 (BP Location: Right Arm, Patient Position: Sitting, Cuff Size: Normal)   Pulse 67   Temp 97.8 F (36.6 C) (Oral)   Ht '5\' 11"'$  (1.803 m)   Wt 185 lb (83.9 kg)   SpO2 98%   BMI 25.80 kg/m    Physical Exam Vitals reviewed.  Constitutional:      Appearance: Normal appearance. He is well-groomed and normal weight.  HENT:     Right Ear: Tympanic membrane and ear canal normal.     Left Ear: Tympanic membrane and ear canal  normal.     Mouth/Throat:     Mouth: Mucous membranes are moist.     Pharynx: No posterior oropharyngeal erythema.  Eyes:     Extraocular Movements: Extraocular movements intact.     Conjunctiva/sclera: Conjunctivae normal.  Neck:     Thyroid: No thyromegaly.  Cardiovascular:     Rate and Rhythm: Normal rate and regular rhythm.     Heart sounds: S1 normal and S2 normal. No murmur heard. Pulmonary:     Effort: Pulmonary effort is normal.     Breath sounds: Normal breath sounds and air entry. No rales.  Abdominal:     General: Abdomen is flat. Bowel sounds are normal.  Musculoskeletal:     Right lower leg: No edema.     Left lower leg: No edema.  Neurological:      General: No focal deficit present.     Mental Status: He is alert and oriented to person, place, and time.     Gait: Gait is intact.  Psychiatric:        Mood and Affect: Mood and affect normal.      No results found for any visits on 04/15/22.     Assessment & Plan:    Routine Health Maintenance and Physical Exam  Immunization History  Administered Date(s) Administered   Fluad Quad(high Dose 65+) 10/24/2018, 01/07/2020   Influenza Split 10/12/2011   Influenza Whole 11/01/2008   Influenza, High Dose Seasonal PF 11/05/2014, 12/02/2015, 11/30/2016, 12/09/2017   Influenza,inj,Quad PF,6+ Mos 02/12/2013   Influenza-Unspecified 12/09/2017   PFIZER(Purple Top)SARS-COV-2 Vaccination 03/09/2019, 04/03/2019, 11/05/2019   Pneumococcal Conjugate-13 11/30/2016   Pneumococcal Polysaccharide-23 12/09/2017   Td 02/01/2002   Tdap 04/17/2013    Health Maintenance  Topic Date Due   COVID-19 Vaccine (4 - 2023-24 season) 05/01/2022 (Originally 10/02/2021)   INFLUENZA VACCINE  05/02/2022 (Originally 09/01/2021)   Zoster Vaccines- Shingrix (1 of 2) 07/16/2022 (Originally 09/09/1998)   Medicare Annual Wellness (AWV)  04/15/2023   DTaP/Tdap/Td (3 - Td or Tdap) 04/18/2023   COLONOSCOPY (Pts 45-28yr Insurance coverage will need to be confirmed)  03/01/2025   Pneumonia Vaccine 72 Years old  Completed   Hepatitis C Screening  Completed   HPV VACCINES  Aged Out    Discussed health benefits of physical activity, and encouraged him to engage in regular exercise appropriate for his age and condition.  Problem List Items Addressed This Visit       Unprioritized   BPH associated with nocturia   Relevant Orders   PSA   B12 deficiency - Primary (Chronic)   Relevant Orders   Vitamin B12   Other Visit Diagnoses     Adult Routine Physical Exam   Relevant Orders   Normal physical exam findings today. I have ordered his annual bloodwork for surveillance. We discussed healthy sleep habits and handouts  given on healthy diet.  CBC with Differential/Platelet   Comprehensive metabolic panel   Lipid screening       Relevant Orders   Lipid panel   Muscle cramp, nocturnal       Relevant Orders   Magnesium      Return in about 1 year (around 04/15/2023) for annual physical exam.     BFarrel Conners MD

## 2022-04-15 NOTE — Patient Instructions (Signed)
Health Maintenance, Male Adopting a healthy lifestyle and getting preventive care are important in promoting health and wellness. Ask your health care provider about: The right schedule for you to have regular tests and exams. Things you can do on your own to prevent diseases and keep yourself healthy. What should I know about diet, weight, and exercise? Eat a healthy diet  Eat a diet that includes plenty of vegetables, fruits, low-fat dairy products, and lean protein. Do not eat a lot of foods that are high in solid fats, added sugars, or sodium. Maintain a healthy weight Body mass index (BMI) is a measurement that can be used to identify possible weight problems. It estimates body fat based on height and weight. Your health care provider can help determine your BMI and help you achieve or maintain a healthy weight. Get regular exercise Get regular exercise. This is one of the most important things you can do for your health. Most adults should: Exercise for at least 150 minutes each week. The exercise should increase your heart rate and make you sweat (moderate-intensity exercise). Do strengthening exercises at least twice a week. This is in addition to the moderate-intensity exercise. Spend less time sitting. Even light physical activity can be beneficial. Watch cholesterol and blood lipids Have your blood tested for lipids and cholesterol at 74 years of age, then have this test every 5 years. You may need to have your cholesterol levels checked more often if: Your lipid or cholesterol levels are high. You are older than 74 years of age. You are at high risk for heart disease. What should I know about cancer screening? Many types of cancers can be detected early and may often be prevented. Depending on your health history and family history, you may need to have cancer screening at various ages. This may include screening for: Colorectal cancer. Prostate cancer. Skin cancer. Lung  cancer. What should I know about heart disease, diabetes, and high blood pressure? Blood pressure and heart disease High blood pressure causes heart disease and increases the risk of stroke. This is more likely to develop in people who have high blood pressure readings or are overweight. Talk with your health care provider about your target blood pressure readings. Have your blood pressure checked: Every 3-5 years if you are 18-39 years of age. Every year if you are 40 years old or older. If you are between the ages of 65 and 75 and are a current or former smoker, ask your health care provider if you should have a one-time screening for abdominal aortic aneurysm (AAA). Diabetes Have regular diabetes screenings. This checks your fasting blood sugar level. Have the screening done: Once every three years after age 45 if you are at a normal weight and have a low risk for diabetes. More often and at a younger age if you are overweight or have a high risk for diabetes. What should I know about preventing infection? Hepatitis B If you have a higher risk for hepatitis B, you should be screened for this virus. Talk with your health care provider to find out if you are at risk for hepatitis B infection. Hepatitis C Blood testing is recommended for: Everyone born from 1945 through 1965. Anyone with known risk factors for hepatitis C. Sexually transmitted infections (STIs) You should be screened each year for STIs, including gonorrhea and chlamydia, if: You are sexually active and are younger than 74 years of age. You are older than 74 years of age and your   health care provider tells you that you are at risk for this type of infection. Your sexual activity has changed since you were last screened, and you are at increased risk for chlamydia or gonorrhea. Ask your health care provider if you are at risk. Ask your health care provider about whether you are at high risk for HIV. Your health care provider  may recommend a prescription medicine to help prevent HIV infection. If you choose to take medicine to prevent HIV, you should first get tested for HIV. You should then be tested every 3 months for as long as you are taking the medicine. Follow these instructions at home: Alcohol use Do not drink alcohol if your health care provider tells you not to drink. If you drink alcohol: Limit how much you have to 0-2 drinks a day. Know how much alcohol is in your drink. In the [place] one drink equals one 12 oz bottle of beer (355 mL), one 5 oz glass of wine (148 mL), or one 1 oz glass of hard liquor (44 mL). Lifestyle Do not use any products that contain nicotine or tobacco. These products include cigarettes, chewing tobacco, and vaping devices, such as e-cigarettes. If you need help quitting, ask your health care provider. Do not use street drugs. Do not share needles. Ask your health care provider for help if you need support or information about quitting drugs. General instructions Schedule regular health, dental, and eye exams. Stay current with your vaccines. Tell your health care provider if: You often feel depressed. You have ever been abused or do not feel safe at home. Summary Adopting a healthy lifestyle and getting preventive care are important in promoting health and wellness. Follow your health care provider's instructions about healthy diet, exercising, and getting tested or screened for diseases. Follow your health care provider's instructions on monitoring your cholesterol and blood pressure. This information is not intended to replace advice given to you by your health care provider. Make sure you discuss any questions you have with your health care provider. Document Revised: 06/09/2020 Document Reviewed: 06/09/2020 Elsevier Patient Education  2023 Elsevier Inc.  

## 2022-05-05 ENCOUNTER — Telehealth: Payer: Self-pay | Admitting: Family Medicine

## 2022-05-05 NOTE — Telephone Encounter (Signed)
Called patient to schedule Medicare Annual Wellness Visit (AWV). Left message for patient to call back and schedule Medicare Annual Wellness Visit (AWV).  Last date of AWV: 01/08/21  Please schedule an appointment at any time with [facility] or [facility].  If any questions, please contact me at [phone].  Thank you ,  Barkley Boards AWV direct phone # [phone]

## 2022-05-25 NOTE — Progress Notes (Unsigned)
[name] [address] [place] [address] Phone: [phone] Subjective:   Jacob [name], am serving as a scribe for Dr. Antoine Primas.  I'm seeing this patient by the request  of:  Karie Georges, MD  CC: Low back pain  [id number]  Jacob [name]. is a 73 y.o. male coming in with complaint of back and neck pain. OMT 04/08/2022.Patient states that he has been feeling good since last visit. If he walks a lot, his back will hurt but overall is doing much better.   Medications patient has been prescribed: Meloxicam  Taking:         Reviewed prior external information including notes and imaging from previsou exam, outside providers and external EMR if available.   As well as notes that were available from care everywhere and other healthcare systems.  Past medical history, social, surgical and family history all reviewed in electronic medical record.  No pertanent information unless stated regarding to the chief complaint.   Past Medical History:  Diagnosis Date   Allergy    SEASONAL   Arthritis    SHOULDERS   BPH associated with nocturia    Hyperlipidemia    "NOT ON MEDICATIONS"    Allergies  Allergen Reactions   Other Other (See Comments)    Pet dander, tree mold, ragweed, pollen cause sneezing and drainage     Review of Systems:  No headache, visual changes, nausea, vomiting, diarrhea, constipation, dizziness, abdominal pain, skin rash, fevers, chills, night sweats, weight loss, swollen lymph nodes, body aches, joint swelling, chest pain, shortness of breath, mood changes. POSITIVE muscle aches  Objective  Blood pressure 120/84, pulse 68, height  (1.803 m), weight 182 lb (82.6 kg), SpO2 98 %.   General: No apparent distress alert and oriented x3 mood and affect normal, dressed appropriately.  HEENT: Pupils equal, extraocular movements intact  Respiratory: Patient's speak in full sentences and does not appear  short of breath  Cardiovascular: No lower extremity edema, non tender, no erythema  Low back exam does have some loss of lordosis.  Tenderness to palpation of the paraspinal musculature.  Tightness noted in the sacroiliac joints bilaterally.  Some weakness noted of the hip abductor still.  Osteopathic findings  C2 flexed rotated and side bent right C6 flexed rotated and side bent left T3 extended rotated and side bent right inhaled rib T9 extended rotated and side bent left L2 flexed rotated and side bent right L5 flexed rotated and side bent left Sacrum right on right       Assessment and Plan:  SI (sacroiliac) joint dysfunction Patient does have some arthritis noted. Tenderness to palpation.  Overall it is doing well and not stopping with any activity.  Given stronger therapy and that I think will be more beneficial.  Follow-up with me again in 6 to 8 weeks    Nonallopathic problems  Decision today to treat with OMT was based on Physical Exam  After verbal consent patient was treated with HVLA, ME, FPR techniques in cervical, rib, thoracic, lumbar, and sacral  areas  Patient tolerated the procedure well with improvement in symptoms  Patient given exercises, stretches and lifestyle modifications  See medications in patient instructions if given  Patient will follow up in 4-8 weeks             Note: This dictation was prepared with Dragon dictation along with smaller phrase technology. Any transcriptional errors that result from this  process are unintentional.

## 2022-05-27 ENCOUNTER — Ambulatory Visit: Payer: Medicare HMO | Admitting: Family Medicine

## 2022-05-27 VITALS — BP 120/84 | HR 68 | Ht 71.0 in | Wt 182.0 lb

## 2022-05-27 DIAGNOSIS — M533 Sacrococcygeal disorders, not elsewhere classified: Secondary | ICD-10-CM

## 2022-05-27 DIAGNOSIS — M9908 Segmental and somatic dysfunction of rib cage: Secondary | ICD-10-CM

## 2022-05-27 DIAGNOSIS — M9901 Segmental and somatic dysfunction of cervical region: Secondary | ICD-10-CM | POA: Diagnosis not present

## 2022-05-27 DIAGNOSIS — M9903 Segmental and somatic dysfunction of lumbar region: Secondary | ICD-10-CM | POA: Diagnosis not present

## 2022-05-27 DIAGNOSIS — M9904 Segmental and somatic dysfunction of sacral region: Secondary | ICD-10-CM | POA: Diagnosis not present

## 2022-05-27 DIAGNOSIS — M9902 Segmental and somatic dysfunction of thoracic region: Secondary | ICD-10-CM

## 2022-05-27 NOTE — Assessment & Plan Note (Addendum)
Patient does have some arthritis noted. Tenderness to palpation.  Overall it is doing well and not stopping with any activity.  Given stronger therapy and that I think will be more beneficial.  Follow-up with me again in 6 to 8 weeks has meloxicam for breakthrough pain.

## 2022-05-27 NOTE — Patient Instructions (Signed)
Great to see you Band work See me again in 6-8 weeks

## 2022-06-23 ENCOUNTER — Telehealth: Payer: Self-pay | Admitting: Family Medicine

## 2022-06-23 NOTE — Telephone Encounter (Signed)
error 

## 2022-07-13 NOTE — Progress Notes (Unsigned)
[name] [address] [place] [address] Phone: [phone] Subjective:   Jacob [name], am serving as a scribe for Dr. Antoine Primas.  I'm seeing this patient by the request  of:  Karie Georges, MD  CC: Back and neck pain follow-up  [id number]  Jacob [name]. is a 72 y.o. male coming in with complaint of back and neck pain. OMT 05/27/2022. Patient states that he has been feeling good. Playing golf 3x a week. Walking 2 miles 2x a week.  Some tightness on the left side of the hip.  Sometimes can be uncomfortable at night.  Sometimes seems to wake him up at night.  Medications patient has been prescribed: Meloxicam  Taking:         Reviewed prior external information including notes and imaging from previsou exam, outside providers and external EMR if available.   As well as notes that were available from care everywhere and other healthcare systems.  Past medical history, social, surgical and family history all reviewed in electronic medical record.  No pertanent information unless stated regarding to the chief complaint.   Past Medical History:  Diagnosis Date   Allergy    SEASONAL   Arthritis    SHOULDERS   BPH associated with nocturia    Hyperlipidemia    "NOT ON MEDICATIONS"    Allergies  Allergen Reactions   Other Other (See Comments)    Pet dander, tree mold, ragweed, pollen cause sneezing and drainage     Review of Systems:  No headache, visual changes, nausea, vomiting, diarrhea, constipation, dizziness, abdominal pain, skin rash, fevers, chills, night sweats, weight loss, swollen lymph nodes, body aches, joint swelling, chest pain, shortness of breath, mood changes. POSITIVE muscle aches  Objective  Blood pressure 124/88, pulse 70, height 5\' 11"  (1.803 m), weight 177 lb (80.3 kg), SpO2 99 %.   General: No apparent distress alert and oriented x3 mood and affect normal, dressed appropriately.  HEENT:  Pupils equal, extraocular movements intact  Respiratory: Patient's speak in full sentences and does not appear short of breath  Cardiovascular: No lower extremity edema, non tender, no erythema  Patient's back exam does have some tenderness to palpation in the paraspinal musculature.  Some tightness noted on the left [facility].  Osteopathic findings  C6 flexed rotated and side bent left T5 extended rotated and side bent right inhaled rib T6 extended rotated and side bent left L2 flexed rotated and side bent right L3 flexed rotated and side bent left Sacrum right on right       Assessment and Plan:  SI (sacroiliac) joint dysfunction Low back seems to be doing relatively well and had more tightness actually noted in the neck in the upper back today.  Likely secondary to trying to learn new golf swing.  Discussed which activities to do and which ones to avoid.  Increase activity slowly.  Discussed with patient to continue to work on the hip abductor strengthening.  Positioning on his bike.  Follow-up again in 2 to 3 months    Nonallopathic problems  Decision today to treat with OMT was based on Physical Exam  After verbal consent patient was treated with HVLA, ME, FPR techniques in cervical, rib, thoracic, lumbar, and sacral  areas  Patient tolerated the procedure well with improvement in symptoms  Patient given exercises, stretches and lifestyle modifications  See medications in patient instructions if given  Patient will follow up in 4-8 weeks  The above documentation has been reviewed and is accurate and complete Judi Saa, DO         Note: This dictation was prepared with Dragon dictation along with smaller phrase technology. Any transcriptional errors that result from this process are unintentional.

## 2022-07-15 ENCOUNTER — Ambulatory Visit: Payer: Medicare HMO | Admitting: Family Medicine

## 2022-07-15 ENCOUNTER — Encounter: Payer: Self-pay | Admitting: Family Medicine

## 2022-07-15 VITALS — BP 124/88 | HR 70 | Ht 71.0 in | Wt 177.0 lb

## 2022-07-15 DIAGNOSIS — M9901 Segmental and somatic dysfunction of cervical region: Secondary | ICD-10-CM | POA: Diagnosis not present

## 2022-07-15 DIAGNOSIS — M9904 Segmental and somatic dysfunction of sacral region: Secondary | ICD-10-CM | POA: Diagnosis not present

## 2022-07-15 DIAGNOSIS — M533 Sacrococcygeal disorders, not elsewhere classified: Secondary | ICD-10-CM | POA: Diagnosis not present

## 2022-07-15 DIAGNOSIS — M9902 Segmental and somatic dysfunction of thoracic region: Secondary | ICD-10-CM

## 2022-07-15 DIAGNOSIS — M9908 Segmental and somatic dysfunction of rib cage: Secondary | ICD-10-CM | POA: Diagnosis not present

## 2022-07-15 DIAGNOSIS — M9903 Segmental and somatic dysfunction of lumbar region: Secondary | ICD-10-CM | POA: Diagnosis not present

## 2022-07-15 NOTE — Patient Instructions (Signed)
Always great to see you  Ice is your friend and try it at night after some voltaren on side of hip  See me again in 2-3 months! Enjoy the grand kids!

## 2022-07-15 NOTE — Assessment & Plan Note (Signed)
Low back seems to be doing relatively well and had more tightness actually noted in the neck in the upper back today.  Likely secondary to trying to learn new golf swing.  Discussed which activities to do and which ones to avoid.  Increase activity slowly.  Discussed with patient to continue to work on the hip abductor strengthening.  Positioning on his bike.  Follow-up again in 2 to 3 months

## 2022-07-27 ENCOUNTER — Other Ambulatory Visit: Payer: Self-pay | Admitting: Family Medicine

## 2022-07-27 MED ORDER — MELOXICAM 15 MG PO TABS: 15.0000 mg | ORAL_TABLET | Freq: Every day | ORAL | 0 refills | Status: DC

## 2022-08-26 ENCOUNTER — Telehealth: Payer: Medicare HMO | Admitting: Family Medicine

## 2022-09-21 NOTE — Progress Notes (Unsigned)
[name] [address] [place] [address] Phone: [phone] Subjective:   Jacob [name], am serving as a scribe for Dr. Antoine Primas.  I'm seeing this patient by the request  of:  Karie Georges, MD  CC: Low back pain follow-up  BJY:[id number]  Jacob [name]. is a 72 y.o. male coming in with complaint of back and neck pain. OMT 07/15/2022. Patient states doing better. Able to do activity and have less pain afterwards. No new concerns.  Medications patient has been prescribed: None  Taking:         Reviewed prior external information including notes and imaging from previsou exam, outside providers and external EMR if available.   As well as notes that were available from care everywhere and other healthcare systems.  Past medical history, social, surgical and family history all reviewed in electronic medical record.  No pertanent information unless stated regarding to the chief complaint.   Past Medical History:  Diagnosis Date   Allergy    SEASONAL   Arthritis    SHOULDERS   BPH associated with nocturia    Hyperlipidemia    "NOT ON MEDICATIONS"    Allergies  Allergen Reactions   Other Other (See Comments)    Pet dander, tree mold, ragweed, pollen cause sneezing and drainage     Review of Systems:  No headache, visual changes, nausea, vomiting, diarrhea, constipation, dizziness, abdominal pain, skin rash, fevers, chills, night sweats, weight loss, swollen lymph nodes, body aches, joint swelling, chest pain, shortness of breath, mood changes. POSITIVE muscle aches  Objective  Blood pressure (!) 118/90, pulse 72, height 5\' 11"  (1.803 m), weight 176 lb (79.8 kg), SpO2 99%.   General: No apparent distress alert and oriented x3 mood and affect normal, dressed appropriately.  HEENT: Pupils equal, extraocular movements intact  Respiratory: Patient's speak in full sentences and does not appear short of breath   Cardiovascular: No lower extremity edema, non tender, no erythema  Back does have some loss lordosis noted.  Tender to palpation over the paraspinal musculature.  Seems to be more around the sacroiliac joint but overall doing relatively well.  Tightness in the parascapular area as well or more in the thoracolumbar juncture Osteopathic findings  T9 extended rotated and side bent left T11 flexed rotated and side bent right L2 flexed rotated and side bent right Sacrum right on right       Assessment and Plan:  SI (sacroiliac) joint dysfunction Patient has been doing remarkably well.  Doing more golf and continuing to stay active.  Still seems to be more with sitting seems to give him more discomfort and pain than anything else at this time.  Discussed still with long road trips to take more frequent breaks.  Discussed continuing to keep core strength.  Follow-up again in 12 weeks    Nonallopathic problems  Decision today to treat with OMT was based on Physical Exam  After verbal consent patient was treated with HVLA, ME, FPR techniques in thoracic, lumbar, and sacral  areas  Patient tolerated the procedure well with improvement in symptoms  Patient given exercises, stretches and lifestyle modifications  See medications in patient instructions if given  Patient will follow up in 4-8 weeks             Note: This dictation was prepared with Dragon dictation along with smaller phrase technology. Any transcriptional errors that result from this process are unintentional.

## 2022-09-23 ENCOUNTER — Encounter: Payer: Self-pay | Admitting: Family Medicine

## 2022-09-23 ENCOUNTER — Ambulatory Visit: Payer: Medicare HMO | Admitting: Family Medicine

## 2022-09-23 VITALS — BP 118/90 | HR 72 | Ht 71.0 in | Wt 176.0 lb

## 2022-09-23 DIAGNOSIS — M9902 Segmental and somatic dysfunction of thoracic region: Secondary | ICD-10-CM

## 2022-09-23 DIAGNOSIS — M9903 Segmental and somatic dysfunction of lumbar region: Secondary | ICD-10-CM

## 2022-09-23 DIAGNOSIS — M533 Sacrococcygeal disorders, not elsewhere classified: Secondary | ICD-10-CM | POA: Diagnosis not present

## 2022-09-23 DIAGNOSIS — M9904 Segmental and somatic dysfunction of sacral region: Secondary | ICD-10-CM

## 2022-09-23 NOTE — Assessment & Plan Note (Addendum)
Patient has been doing remarkably well.  Doing more golf and continuing to stay active.  Still seems to be more with sitting seems to give him more discomfort and pain than anything else at this time.  Discussed still with long road trips to take more frequent breaks.  Discussed continuing to keep core strength.  Follow-up again in 12 weeks

## 2022-09-23 NOTE — Patient Instructions (Signed)
See you again in 3 months 

## 2022-10-05 ENCOUNTER — Telehealth: Payer: Self-pay | Admitting: Family Medicine

## 2022-10-05 NOTE — Telephone Encounter (Signed)
Pt call and stated he tested positive for covid and want to know if dr. Casimiro Needle will call him is something and what do he need to do pt want a call back.

## 2022-10-06 ENCOUNTER — Encounter: Payer: Self-pay | Admitting: Family Medicine

## 2022-10-06 ENCOUNTER — Telehealth (INDEPENDENT_AMBULATORY_CARE_PROVIDER_SITE_OTHER): Payer: Medicare HMO | Admitting: Family Medicine

## 2022-10-06 ENCOUNTER — Other Ambulatory Visit: Payer: Self-pay | Admitting: Family Medicine

## 2022-10-06 VITALS — Temp 99.8°F

## 2022-10-06 DIAGNOSIS — U071 COVID-19: Secondary | ICD-10-CM

## 2022-10-06 MED ORDER — MOLNUPIRAVIR EUA 200MG CAPSULE: 4.0000 | ORAL_CAPSULE | Freq: Two times a day (BID) | ORAL | 0 refills | Status: AC

## 2022-10-06 NOTE — Telephone Encounter (Signed)
Spoke with the patient and scheduled a virtual visit with Dr Caryl Never as PCP does not have openings.

## 2022-10-06 NOTE — Progress Notes (Signed)
Patient ID: Jacob Gerald., male   DOB: 04-Sep-1950, 72 y.o.   MRN: [id number]   Virtual Visit via Video Note  I connected with Jacob [name] on 10/06/22 at  5:30 PM EDT by a video enabled telemedicine application and verified that I am speaking with the correct person using two identifiers.  Location patient: home Location provider:work or home office Persons participating in the virtual visit: patient, provider  I discussed the limitations of evaluation and management by telemedicine and the availability of in person appointments. The patient expressed understanding and agreed to proceed.   HPI:  Jacob [name] has COVID infection.  Onset of symptoms about 36 hours ago.  Woke up around 3 AM Tuesday with symptoms.  Positive home COVID test.  He has had some nasal congestion, fatigue, sore throat, low-grade fever.  Occasional productive cough.  No dyspnea.  Vital signs stable.  Blood pressure earlier today this afternoon 133/82 with pulse of 80.  Denies any nausea, vomiting, or diarrhea.  Generally very healthy.  Takes no regular medications.   ROS: See pertinent positives and negatives per HPI.  Past Medical History:  Diagnosis Date   Allergy    SEASONAL   Arthritis    SHOULDERS   BPH associated with nocturia    Hyperlipidemia    "NOT ON MEDICATIONS"    Past Surgical History:  Procedure Laterality Date   APPENDECTOMY  1973   COLONOSCOPY     KNEE ARTHROSCOPY Left    x2   ROTATOR CUFF REPAIR Bilateral     Family History  Problem Relation Age of Onset   Sarcoidosis Mother        lung   Colon polyps Father    Heart failure Father 72   Rheum arthritis Father    Gout Brother    Heart attack Maternal Grandfather 4   Other Paternal Grandfather 64   Diabetes Neg Hx    Colon cancer Neg Hx    Crohn's disease Neg Hx    Esophageal cancer Neg Hx    Rectal cancer Neg Hx    Stomach cancer Neg Hx    Ulcerative colitis Neg Hx     SOCIAL HX: Non-smoker   Current Outpatient  Medications:    Cetirizine HCl (ZYRTEC ALLERGY PO), Take by mouth daily., Disp: , Rfl:    Cyanocobalamin (B-12 PO), Take 1,000 mcg by mouth 3 (three) times a week., Disp: , Rfl:    Fluticasone Propionate (FLONASE ALLERGY RELIEF NA), Place into the nose as needed., Disp: , Rfl:    meloxicam (MOBIC) 15 MG tablet, Take 1 tablet (15 mg total) by mouth daily., Disp: 30 tablet, Rfl: 0   molnupiravir EUA (LAGEVRIO) 200 mg CAPS capsule, Take 4 capsules (800 mg total) by mouth 2 (two) times daily for 5 days., Disp: 40 capsule, Rfl: 0   Multiple Vitamin (MULTIVITAMIN ADULT PO), Take by mouth daily., Disp: , Rfl:    Multiple Vitamins-Minerals (OCUVITE PO), Take by mouth daily., Disp: , Rfl:    Probiotic Product (PROBIOTIC PO), Take by mouth daily., Disp: , Rfl:   EXAM:  VITALS per patient if applicable:  GENERAL: alert, oriented, appears well and in no acute distress  HEENT: atraumatic, conjunttiva clear, no obvious abnormalities on inspection of external nose and ears  NECK: normal movements of the head and neck  LUNGS: on inspection no signs of respiratory distress, breathing rate appears normal, no obvious gross SOB, gasping or wheezing  CV: no obvious cyanosis  MS: moves all  visible extremities without noticeable abnormality  PSYCH/NEURO: pleasant and cooperative, no obvious depression or anxiety, speech and thought processing grossly intact  ASSESSMENT AND PLAN:  Discussed the following assessment and plan:  Generally healthy 72 year old male with COVID infection.  He is doing fairly well currently.  He would like to consider antivirals and specifically had requested molnupiravir over Paxlovid.  He does not have any history of significant kidney disease.  -Continue plenty of fluids and rest -Continue Tylenol as needed for body aches -Molnupiravir 4 capsules by mouth twice daily for 5 days -Follow-up for any persistent or worsening symptoms     I discussed the assessment and  treatment plan with the patient. The patient was provided an opportunity to ask questions and all were answered. The patient agreed with the plan and demonstrated an understanding of the instructions.   The patient was advised to call back or seek an in-person evaluation if the symptoms worsen or if the condition fails to improve as anticipated.     Evelena Peat, MD

## 2022-10-07 ENCOUNTER — Other Ambulatory Visit: Payer: Self-pay | Admitting: Family Medicine

## 2022-10-08 NOTE — Telephone Encounter (Signed)
Noted  

## 2022-12-21 NOTE — Progress Notes (Unsigned)
[name] [address] [place] [address] Phone: [phone] Subjective:   [name], am serving as a scribe for Dr. Antoine Primas.  I'm seeing this patient by the request  of:  Karie Georges, MD  CC: Back and neck pain follow-up  BJY:NWGNFAOZHY  Jacob [name]. is a 72 y.o. male coming in with complaint of back and neck pain. OMT 09/23/2022. Patient states that he has been doing well.  Patient continues to have some discomfort and pain in the paraspinal musculature.  Patient states that it sleeping in different beds has caused some difficulty.  Having some increasing neck discomfort as well.  Medications patient has been prescribed: Meloxicam   Taking:         Reviewed prior external information including notes and imaging from previsou exam, outside providers and external EMR if available.   As well as notes that were available from care everywhere and other healthcare systems.  Past medical history, social, surgical and family history all reviewed in electronic medical record.  No pertanent information unless stated regarding to the chief complaint.   Past Medical History:  Diagnosis Date   Allergy    SEASONAL   Arthritis    SHOULDERS   BPH associated with nocturia    Hyperlipidemia    "NOT ON MEDICATIONS"    Allergies  Allergen Reactions   Other Other (See Comments)    Pet dander, tree mold, ragweed, pollen cause sneezing and drainage     Review of Systems:  No headache, visual changes, nausea, vomiting, diarrhea, constipation, dizziness, abdominal pain, skin rash, fevers, chills, night sweats, weight loss, swollen lymph nodes, body aches, joint swelling, chest pain, shortness of breath, mood changes. POSITIVE muscle aches  Objective  Blood pressure 128/88, pulse 74, height 5\' 11"  (1.803 m), weight 177 lb (80.3 kg), SpO2 98%.   General: No apparent distress alert and oriented x3 mood and affect normal,  dressed appropriately.  HEENT: Pupils equal, extraocular movements intact  Respiratory: Patient's speak in full sentences and does not appear short of breath  Cardiovascular: No lower extremity edema, non tender, no erythema  Tightness noted in the low back from the thoracolumbar juncture all the way down to the sacral area.  Osteopathic findings  C2 flexed rotated and side bent right C6 flexed rotated and side bent left T3 extended rotated and side bent right inhaled rib T9 extended rotated and side bent left L2 flexed rotated and side bent right L4 flexed rotated and side bent left Sacrum right on right       Assessment and Plan:  SI (sacroiliac) joint dysfunction Chronic with mild exacerbation with patient being in different beds.  We discussed which activities to do and which ones to avoid.  Increase activity slowly otherwise.  Discussed which activities to do and which ones to avoid.  Follow-up with me again in 6 to 8 weeks.  DEGENERATIVE DISC DISEASE, CERVICAL SPINE, W/RADICULOPATHY Likely no radicular symptoms at the moment but does have significant tightness.  Discussed different pillow choices that I think will be beneficial.  Discussed icing regimen and home exercises, increase activity slowly over the course of next several weeks.  Follow-up again in 6 to 8 weeks    Nonallopathic problems  Decision today to treat with OMT was based on Physical Exam  After verbal consent patient was treated with HVLA, ME, FPR techniques in cervical, rib, thoracic, lumbar, and sacral  areas  Patient tolerated the  procedure well with improvement in symptoms  Patient given exercises, stretches and lifestyle modifications  See medications in patient instructions if given  Patient will follow up in 4-8 weeks    The above documentation has been reviewed and is accurate and complete Judi Saa, DO          Note: This dictation was prepared with Dragon dictation along with  smaller phrase technology. Any transcriptional errors that result from this process are unintentional.

## 2022-12-23 ENCOUNTER — Ambulatory Visit: Payer: Medicare HMO | Admitting: Family Medicine

## 2022-12-23 ENCOUNTER — Encounter: Payer: Self-pay | Admitting: Family Medicine

## 2022-12-23 VITALS — BP 128/88 | HR 74 | Ht 71.0 in | Wt 177.0 lb

## 2022-12-23 DIAGNOSIS — M9908 Segmental and somatic dysfunction of rib cage: Secondary | ICD-10-CM

## 2022-12-23 DIAGNOSIS — M9901 Segmental and somatic dysfunction of cervical region: Secondary | ICD-10-CM

## 2022-12-23 DIAGNOSIS — M533 Sacrococcygeal disorders, not elsewhere classified: Secondary | ICD-10-CM | POA: Diagnosis not present

## 2022-12-23 DIAGNOSIS — M9902 Segmental and somatic dysfunction of thoracic region: Secondary | ICD-10-CM | POA: Diagnosis not present

## 2022-12-23 DIAGNOSIS — M9903 Segmental and somatic dysfunction of lumbar region: Secondary | ICD-10-CM

## 2022-12-23 DIAGNOSIS — M5020 Other cervical disc displacement, unspecified cervical region: Secondary | ICD-10-CM

## 2022-12-23 DIAGNOSIS — M9904 Segmental and somatic dysfunction of sacral region: Secondary | ICD-10-CM

## 2022-12-23 NOTE — Assessment & Plan Note (Signed)
Chronic with mild exacerbation with patient being in different beds.  We discussed which activities to do and which ones to avoid.  Increase activity slowly otherwise.  Discussed which activities to do and which ones to avoid.  Follow-up with me again in 6 to 8 weeks.

## 2022-12-23 NOTE — Assessment & Plan Note (Signed)
Likely no radicular symptoms at the moment but does have significant tightness.  Discussed different pillow choices that I think will be beneficial.  Discussed icing regimen and home exercises, increase activity slowly over the course of next several weeks.  Follow-up again in 6 to 8 weeks

## 2022-12-23 NOTE — Patient Instructions (Signed)
COOP pillow [place], [place], Beauty rest [place] See me in 2-3 months

## 2023-01-17 ENCOUNTER — Other Ambulatory Visit: Payer: Self-pay | Admitting: Family Medicine

## 2023-01-17 MED ORDER — MELOXICAM 15 MG PO TABS: 15.0000 mg | ORAL_TABLET | Freq: Every day | ORAL | 0 refills | Status: DC

## 2023-01-17 NOTE — Telephone Encounter (Signed)
Last OV 12/23/22 Next OV 03/03/23  Last refill 07/27/22 Qty #300   Renal labs checked 04/15/22  Component Ref Range & Units (hover) 9 mo ago (04/15/22) 1 yr ago (04/13/21) 2 yr ago (02/26/20) 4 yr ago (07/06/18) 5 yr ago (12/09/17) 6 yr ago (11/30/16) 6 yr ago (11/30/16)  Sodium 140 141 139 141 141  143  Potassium 4.4 4.8 4.3 4.7 4.8  4.3  Chloride 104 104 103 102 103  104  CO2 28 31 32 30 31  31   Glucose, Bld 92 98 95 85 90  86  BUN 19 20 20 17 25  High   20  Creatinine, Ser 1.11 1.13 1.17 1.06 1.19  1.05  Total Bilirubin 0.6 0.6 0.5 0.5 0.6 0.4   Alkaline Phosphatase 42 38 Low  41 50 37 Low  41   AST 17 17 15 14 17 17    ALT 14 16 16 17 16 17    Total Protein 7.1 6.8 6.6 6.7 7.2 6.5   Albumin 4.5 4.8 4.5 4.2 4.8 4.5   GFR 65.84 64.90 CM 62.74 CM 69.10 64.38  74.60  Comment: Calculated using the CKD-EPI Creatinine Equation (2021)  Calcium 9.8 10.2 9.7 9.5 9.9  9.7

## 2023-03-01 NOTE — Progress Notes (Unsigned)
[name] [address] [place] [address] Phone: [phone] Subjective:   Jacob [name], am serving as a scribe for Dr. Antoine Primas.  I'm seeing this patient by the request  of:  Karie Georges, MD  CC: Back and neck pain follow-up  BJY:NWGNFAOZHY  Jacob [name]. is a 73 y.o. male coming in with complaint of back and neck pain. OMT 12/23/2022. Patient states experiencing trigger finger in left hand. Same per usual. No new symptoms or concerns.  Medications patient has been prescribed: Meloxicam  Taking:         Reviewed prior external information including notes and imaging from previsou exam, outside providers and external EMR if available.   As well as notes that were available from care everywhere and other healthcare systems.  Past medical history, social, surgical and family history all reviewed in electronic medical record.  No pertanent information unless stated regarding to the chief complaint.   Past Medical History:  Diagnosis Date   Allergy    SEASONAL   Arthritis    SHOULDERS   BPH associated with nocturia    Hyperlipidemia    "NOT ON MEDICATIONS"    Allergies  Allergen Reactions   Other Other (See Comments)    Pet dander, tree mold, ragweed, pollen cause sneezing and drainage     Review of Systems:  No headache, visual changes, nausea, vomiting, diarrhea, constipation, dizziness, abdominal pain, skin rash, fevers, chills, night sweats, weight loss, swollen lymph nodes, body aches, joint swelling, chest pain, shortness of breath, mood changes. POSITIVE muscle aches  Objective  Blood pressure 118/82, pulse 74, height 5\' 11"  (1.803 m), weight 179 lb (81.2 kg), SpO2 98%.   General: No apparent distress alert and oriented x3 mood and affect normal, dressed appropriately.  HEENT: Pupils equal, extraocular movements intact  Respiratory: Patient's speak in full sentences and does not appear short of breath   Cardiovascular: No lower extremity edema, non tender, no erythema  MSK:  Back does have some loss lordosis noted.  Tightness around the sacroiliac joint noted. Hand exam shows the patient does have a trigger nodule noted in the ring finger on the left hand.  Osteopathic findings  T9 extended rotated and side bent left L2 flexed rotated and side bent right L4 flexed rotated and side bent left Sacrum right on right   Limited muscular skeletal ultrasound was performed and interpreted by Antoine Primas, M   Limited ultrasound shows a patient does have hypoechoic changes in the flexor tendon sheath near the A2 pulley of the ring finger on the left hand.  This is consistent with a trigger nodule.  No masses appreciated. Impression: Trigger finger    Assessment and Plan:  SI (sacroiliac) joint dysfunction Tightness noted.  Still responding well to osteopathic manipulation.  Discussed icing regimen and home exercises, increase activity slowly.  Discussed with patient to continue stay active.  Has been riding his bike and playing a lot of golf.  Will monitor.  Follow-up again in 2 months for further evaluation and treatment  Acquired trigger finger of left ring finger Does have a trigger nodule noted on ultrasound today.  Discussed with patient about bracing and home exercises with massage and icing regimen.  Worsening symptoms would consider injection.  Patient will be following up again in 2 months    Nonallopathic problems  Decision today to treat with OMT was based on Physical Exam  After verbal consent patient was treated  with HVLA, ME, FPR techniques in thoracic, lumbar, and sacral  areas  Patient tolerated the procedure well with improvement in symptoms  Patient given exercises, stretches and lifestyle modifications  See medications in patient instructions if given  Patient will follow up in 4-8 weeks      The above documentation has been reviewed and is accurate and  complete Judi Saa, DO        Note: This dictation was prepared with Dragon dictation along with smaller phrase technology. Any transcriptional errors that result from this process are unintentional.

## 2023-03-03 ENCOUNTER — Ambulatory Visit: Payer: Medicare HMO | Admitting: Family Medicine

## 2023-03-03 ENCOUNTER — Other Ambulatory Visit: Payer: Self-pay

## 2023-03-03 ENCOUNTER — Encounter: Payer: Self-pay | Admitting: Family Medicine

## 2023-03-03 VITALS — BP 118/82 | HR 74 | Ht 71.0 in | Wt 179.0 lb

## 2023-03-03 DIAGNOSIS — M9902 Segmental and somatic dysfunction of thoracic region: Secondary | ICD-10-CM | POA: Diagnosis not present

## 2023-03-03 DIAGNOSIS — M533 Sacrococcygeal disorders, not elsewhere classified: Secondary | ICD-10-CM | POA: Diagnosis not present

## 2023-03-03 DIAGNOSIS — M6530 Trigger finger, unspecified finger: Secondary | ICD-10-CM | POA: Diagnosis not present

## 2023-03-03 DIAGNOSIS — M65342 Trigger finger, left ring finger: Secondary | ICD-10-CM | POA: Insufficient documentation

## 2023-03-03 DIAGNOSIS — M9903 Segmental and somatic dysfunction of lumbar region: Secondary | ICD-10-CM

## 2023-03-03 DIAGNOSIS — M9904 Segmental and somatic dysfunction of sacral region: Secondary | ICD-10-CM | POA: Diagnosis not present

## 2023-03-03 NOTE — Assessment & Plan Note (Signed)
Tightness noted.  Still responding well to osteopathic manipulation.  Discussed icing regimen and home exercises, increase activity slowly.  Discussed with patient to continue stay active.  Has been riding his bike and playing a lot of golf.  Will monitor.  Follow-up again in 2 months for further evaluation and treatment

## 2023-03-03 NOTE — Assessment & Plan Note (Signed)
Does have a trigger nodule noted on ultrasound today.  Discussed with patient about bracing and home exercises with massage and icing regimen.  Worsening symptoms would consider injection.  Patient will be following up again in 2 months

## 2023-03-03 NOTE — Patient Instructions (Signed)
Frog splint at night Heat and massage 1-2x day  [place] is fine See you again in 2 months

## 2023-04-18 ENCOUNTER — Ambulatory Visit (INDEPENDENT_AMBULATORY_CARE_PROVIDER_SITE_OTHER): Payer: Medicare HMO | Admitting: Family Medicine

## 2023-04-18 ENCOUNTER — Encounter: Payer: Self-pay | Admitting: Family Medicine

## 2023-04-18 VITALS — BP 100/70 | HR 73 | Temp 98.3°F | Ht 71.25 in | Wt 181.6 lb

## 2023-04-18 DIAGNOSIS — Z131 Encounter for screening for diabetes mellitus: Secondary | ICD-10-CM

## 2023-04-18 DIAGNOSIS — Z0000 Encounter for general adult medical examination without abnormal findings: Secondary | ICD-10-CM

## 2023-04-18 DIAGNOSIS — R351 Nocturia: Secondary | ICD-10-CM | POA: Diagnosis not present

## 2023-04-18 DIAGNOSIS — E538 Deficiency of other specified B group vitamins: Secondary | ICD-10-CM | POA: Diagnosis not present

## 2023-04-18 DIAGNOSIS — E782 Mixed hyperlipidemia: Secondary | ICD-10-CM

## 2023-04-18 DIAGNOSIS — N401 Enlarged prostate with lower urinary tract symptoms: Secondary | ICD-10-CM

## 2023-04-18 LAB — CBC WITH DIFFERENTIAL/PLATELET
Basophils Absolute: 0.1 10*3/uL (ref 0.0–0.1)
Basophils Relative: 1.2 % (ref 0.0–3.0)
Eosinophils Absolute: 0.1 10*3/uL (ref 0.0–0.7)
Eosinophils Relative: 1.9 % (ref 0.0–5.0)
HCT: 41 % (ref 39.0–52.0)
Hemoglobin: 13.9 g/dL (ref 13.0–17.0)
Lymphocytes Relative: 22.6 % (ref 12.0–46.0)
Lymphs Abs: 1.4 10*3/uL (ref 0.7–4.0)
MCHC: 33.9 g/dL (ref 30.0–36.0)
MCV: 93.9 fl (ref 78.0–100.0)
Monocytes Absolute: 0.5 10*3/uL (ref 0.1–1.0)
Monocytes Relative: 9 % (ref 3.0–12.0)
Neutro Abs: 4 10*3/uL (ref 1.4–7.7)
Neutrophils Relative %: 65.3 % (ref 43.0–77.0)
Platelets: 225 10*3/uL (ref 150.0–400.0)
RBC: 4.37 Mil/uL (ref 4.22–5.81)
RDW: 13.9 % (ref 11.5–15.5)
WBC: 6.1 10*3/uL (ref 4.0–10.5)

## 2023-04-18 LAB — COMPREHENSIVE METABOLIC PANEL
ALT: 12 U/L (ref 0–53)
AST: 13 U/L (ref 0–37)
Albumin: 4.2 g/dL (ref 3.5–5.2)
Alkaline Phosphatase: 36 U/L — ABNORMAL LOW (ref 39–117)
BUN: 17 mg/dL (ref 6–23)
CO2: 29 meq/L (ref 19–32)
Calcium: 9.1 mg/dL (ref 8.4–10.5)
Chloride: 104 meq/L (ref 96–112)
Creatinine, Ser: 1.07 mg/dL (ref 0.40–1.50)
GFR: 68.32 mL/min (ref >60.00)
Glucose, Bld: 90 mg/dL (ref 70–99)
Potassium: 4.3 meq/L (ref 3.5–5.1)
Sodium: 140 meq/L (ref 135–145)
Total Bilirubin: 0.5 mg/dL (ref 0.2–1.2)
Total Protein: 6.1 g/dL (ref 6.0–8.3)

## 2023-04-18 LAB — LIPID PANEL
Cholesterol: 168 mg/dL (ref 0–200)
HDL: 61.8 mg/dL (ref >39.00)
LDL Cholesterol: 96 mg/dL (ref 0–99)
NonHDL: 105.98
Total CHOL/HDL Ratio: 3
Triglycerides: 49 mg/dL (ref 0.0–149.0)
VLDL: 9.8 mg/dL (ref 0.0–40.0)

## 2023-04-18 LAB — PSA, MEDICARE: PSA: 2.02 ng/mL (ref 0.10–4.00)

## 2023-04-18 LAB — VITAMIN B12: Vitamin B-12: 466 pg/mL (ref 211–911)

## 2023-04-18 LAB — HEMOGLOBIN A1C: Hgb A1c MFr Bld: 5.4 % (ref 4.6–6.5)

## 2023-04-18 NOTE — Patient Instructions (Signed)
 Health Maintenance, Male  Adopting a healthy lifestyle and getting preventive care are important in promoting health and wellness. Ask your health care provider about:  The right schedule for you to have regular tests and exams.  Things you can do on your own to prevent diseases and keep yourself healthy.  What should I know about diet, weight, and exercise?  Eat a healthy diet    Eat a diet that includes plenty of vegetables, fruits, low-fat dairy products, and lean protein.  Do not eat a lot of foods that are high in solid fats, added sugars, or sodium.  Maintain a healthy weight  Body mass index (BMI) is a measurement that can be used to identify possible weight problems. It estimates body fat based on height and weight. Your health care provider can help determine your BMI and help you achieve or maintain a healthy weight.  Get regular exercise  Get regular exercise. This is one of the most important things you can do for your health. Most adults should:  Exercise for at least 150 minutes each week. The exercise should increase your heart rate and make you sweat (moderate-intensity exercise).  Do strengthening exercises at least twice a week. This is in addition to the moderate-intensity exercise.  Spend less time sitting. Even light physical activity can be beneficial.  Watch cholesterol and blood lipids  Have your blood tested for lipids and cholesterol at 75 years of age, then have this test every 5 years.  You may need to have your cholesterol levels checked more often if:  Your lipid or cholesterol levels are high.  You are older than 75 years of age.  You are at high risk for heart disease.  What should I know about cancer screening?  Many types of cancers can be detected early and may often be prevented. Depending on your health history and family history, you may need to have cancer screening at various ages. This may include screening for:  Colorectal cancer.  Prostate cancer.  Skin cancer.  Lung  cancer.  What should I know about heart disease, diabetes, and high blood pressure?  Blood pressure and heart disease  High blood pressure causes heart disease and increases the risk of stroke. This is more likely to develop in people who have high blood pressure readings or are overweight.  Talk with your health care provider about your target blood pressure readings.  Have your blood pressure checked:  Every 3-5 years if you are 9-95 years of age.  Every year if you are 85 years old or older.  If you are between the ages of 29 and 29 and are a current or former smoker, ask your health care provider if you should have a one-time screening for abdominal aortic aneurysm (AAA).  Diabetes  Have regular diabetes screenings. This checks your fasting blood sugar level. Have the screening done:  Once every three years after age 23 if you are at a normal weight and have a low risk for diabetes.  More often and at a younger age if you are overweight or have a high risk for diabetes.  What should I know about preventing infection?  Hepatitis B  If you have a higher risk for hepatitis B, you should be screened for this virus. Talk with your health care provider to find out if you are at risk for hepatitis B infection.  Hepatitis C  Blood testing is recommended for:  Everyone born from [birth year] through 1967.  Anyone  with known risk factors for hepatitis C.  Sexually transmitted infections (STIs)  You should be screened each year for STIs, including gonorrhea and chlamydia, if:  You are sexually active and are younger than 75 years of age.  You are older than 75 years of age and your health care provider tells you that you are at risk for this type of infection.  Your sexual activity has changed since you were last screened, and you are at increased risk for chlamydia or gonorrhea. Ask your health care provider if you are at risk.  Ask your health care provider about whether you are at high risk for HIV. Your health care provider  may recommend a prescription medicine to help prevent HIV infection. If you choose to take medicine to prevent HIV, you should first get tested for HIV. You should then be tested every 3 months for as long as you are taking the medicine.  Follow these instructions at home:  Alcohol use  Do not drink alcohol if your health care provider tells you not to drink.  If you drink alcohol:  Limit how much you have to 0-2 drinks a day.  Know how much alcohol is in your drink. In the [place] one drink equals one 12 oz bottle of beer (355 mL), one 5 oz glass of wine (148 mL), or one 1 oz glass of hard liquor (44 mL).  Lifestyle  Do not use any products that contain nicotine or tobacco. These products include cigarettes, chewing tobacco, and vaping devices, such as e-cigarettes. If you need help quitting, ask your health care provider.  Do not use street drugs.  Do not share needles.  Ask your health care provider for help if you need support or information about quitting drugs.  General instructions  Schedule regular health, dental, and eye exams.  Stay current with your vaccines.  Tell your health care provider if:  You often feel depressed.  You have ever been abused or do not feel safe at home.  Summary  Adopting a healthy lifestyle and getting preventive care are important in promoting health and wellness.  Follow your health care provider's instructions about healthy diet, exercising, and getting tested or screened for diseases.  Follow your health care provider's instructions on monitoring your cholesterol and blood pressure.  This information is not intended to replace advice given to you by your health care provider. Make sure you discuss any questions you have with your health care provider.  Document Revised: 06/09/2020 Document Reviewed: 06/09/2020  Elsevier Patient Education  2024 [employer].

## 2023-04-18 NOTE — Progress Notes (Signed)
 Complete physical exam  Patient: Jacob [name].   DOB: 1950/07/11   73 y.o. Male  MRN: [id number]  Subjective:    Chief Complaint  Patient presents with   Annual Exam    Jacob [name]. is a 73 y.o. male who presents today for a complete physical exam. He reports consuming a general diet. Takes fiber supplements, eats nuts and grains, takes probiotics, B12 vitamin, daily MVI, and turmeric capsules for arthritis.Gym/ health club routine includes cardio, stationary bike, and yoga. He generally feels well. He reports sleeping well. He does not have additional problems to discuss today.    Most recent fall risk assessment:    04/18/2023    7:58 AM  Fall Risk   Falls in the past year? 0  Number falls in past yr: 0  Injury with Fall? 0  Risk for fall due to : No Fall Risks  Follow up Falls evaluation completed     Most recent depression screenings:    04/18/2023    7:59 AM 04/15/2022    9:31 AM  PHQ 2/9 Scores  PHQ - 2 Score 0 0  PHQ- 9 Score 0 1    Vision:Within last year and [facility], monitoring for glaucoma nad cataracts, none found and Dental: No current dental problems and Receives regular dental care  Patient Active Problem List   Diagnosis Date Noted   Acquired trigger finger of left ring finger 03/03/2023   B12 deficiency 11/19/2021   Right foot pain 10/27/2021   Left ankle pain 10/09/2020   Seasonal allergies 06/18/2020   SI (sacroiliac) joint dysfunction 02/26/2019   Nonallopathic lesion of sacral region 02/26/2019   Nonallopathic lesion of lumbosacral region 02/26/2019   Nonallopathic lesion of thoracic region 02/26/2019   BPH associated with nocturia 11/05/2014   DEGENERATIVE DISC DISEASE, CERVICAL SPINE, W/RADICULOPATHY 03/18/2009   Hyperlipidemia 08/15/2006   Allergic rhinitis 08/15/2006      Patient Care Team: Karie Georges, MD as PCP - General (Family Medicine) Venancio Poisson, MD as Consulting Physician (Dermatology) Mateo Flow,  MD as Consulting Physician (Ophthalmology)   Outpatient Medications Prior to Visit  Medication Sig   Cyanocobalamin (B-12 PO) Take 1,000 mcg by mouth 3 (three) times a week.   Fluticasone Propionate (FLONASE ALLERGY RELIEF NA) Place into the nose as needed.   meloxicam (MOBIC) 15 MG tablet Take 1 tablet (15 mg total) by mouth daily.   Multiple Vitamin (MULTIVITAMIN ADULT PO) Take by mouth daily.   Probiotic Product (PROBIOTIC PO) Take by mouth daily.   [DISCONTINUED] Cetirizine HCl (ZYRTEC ALLERGY PO) Take by mouth daily.   [DISCONTINUED] Multiple Vitamins-Minerals (OCUVITE PO) Take by mouth daily.   No facility-administered medications prior to visit.    Review of Systems  HENT:  Negative for hearing loss.   Eyes:  Negative for blurred vision.  Respiratory:  Negative for shortness of breath.   Cardiovascular:  Negative for chest pain.  Gastrointestinal: Negative.   Genitourinary: Negative.   Musculoskeletal:  Negative for back pain.  Neurological:  Negative for headaches.  Psychiatric/Behavioral:  Negative for depression.        Objective:     BP 100/70   Pulse 73   Temp 98.3 F (36.8 C) (Oral)   Ht 5' 11.25" (1.81 m)   Wt 181 lb 9.6 oz (82.4 kg)   SpO2 97%   BMI 25.15 kg/m    Physical Exam Vitals reviewed.  Constitutional:      Appearance: Normal appearance.  He is well-groomed and normal weight.  HENT:     Right Ear: Tympanic membrane and ear canal normal.     Left Ear: Tympanic membrane and ear canal normal.     Mouth/Throat:     Mouth: Mucous membranes are moist.     Pharynx: No posterior oropharyngeal erythema.  Eyes:     Extraocular Movements: Extraocular movements intact.     Conjunctiva/sclera: Conjunctivae normal.  Neck:     Thyroid: No thyromegaly.  Cardiovascular:     Rate and Rhythm: Normal rate and regular rhythm.     Heart sounds: S1 normal and S2 normal. No murmur heard. Pulmonary:     Effort: Pulmonary effort is normal.     Breath sounds:  Normal breath sounds and air entry. No rales.  Abdominal:     General: Abdomen is flat. Bowel sounds are normal.  Musculoskeletal:     Right lower leg: No edema.     Left lower leg: No edema.  Lymphadenopathy:     Cervical: No cervical adenopathy.  Neurological:     General: No focal deficit present.     Mental Status: He is alert and oriented to person, place, and time.     Gait: Gait is intact.  Psychiatric:        Mood and Affect: Mood and affect normal.      No results found for any visits on 04/18/23.     Assessment & Plan:    Routine Health Maintenance and Physical Exam  Immunization History  Administered Date(s) Administered   Fluad Quad(high Dose 65+) 10/24/2018, 01/07/2020   Influenza Split 10/12/2011   Influenza Whole 11/01/2008   Influenza, High Dose Seasonal PF 11/05/2014, 12/02/2015, 11/30/2016, 12/09/2017   Influenza,inj,Quad PF,6+ Mos 02/12/2013   Influenza-Unspecified 12/09/2017   PFIZER(Purple Top)SARS-COV-2 Vaccination 03/09/2019, 04/03/2019, 11/05/2019   Pneumococcal Conjugate-13 11/30/2016   Pneumococcal Polysaccharide-23 12/09/2017   Td 02/01/2002   Tdap 04/17/2013    Health Maintenance  Topic Date Due   Medicare Annual Wellness (AWV)  01/08/2022   COVID-19 Vaccine (4 - 2024-25 season) 10/03/2022   DTaP/Tdap/Td (3 - Td or Tdap) 04/18/2023   INFLUENZA VACCINE  05/02/2023 (Originally 09/02/2022)   Zoster Vaccines- Shingrix (1 of 2) 07/19/2023 (Originally 09/09/1998)   Colonoscopy  03/01/2025   Pneumonia Vaccine 90+ Years old  Completed   Hepatitis C Screening  Completed   HPV VACCINES  Aged Out    Discussed health benefits of physical activity, and encouraged him to engage in regular exercise appropriate for his age and condition.  Diabetes mellitus screening -     Hemoglobin A1c; Future  Routine general medical examination at a health care facility -     CBC with Differential/Platelet; Future -     Comprehensive metabolic panel;  Future  Mixed hyperlipidemia -     Lipid panel; Future  B12 deficiency -     Vitamin B12; Future  BPH associated with nocturia -     PSA, Medicare; Future  Normal physical exam findings today. Patient engages in a healthy lifestyle and has no other risk factors for CAD. Labs have been ordered for surveillance Counseled patient on PSA testing, arthritis management. Continue yearly visits. Handouts given on healthy eating and exercise. Reviewed Health maintenance measures, pt is UTD on these except for Tdap vaccination, reminded him to get one.   Return in 1 year (on 04/17/2024).     Karie Georges, MD

## 2023-04-19 ENCOUNTER — Encounter: Payer: Self-pay | Admitting: Family Medicine

## 2023-04-20 ENCOUNTER — Encounter: Payer: Self-pay | Admitting: Family Medicine

## 2023-05-05 ENCOUNTER — Ambulatory Visit: Payer: Medicare HMO | Admitting: Family Medicine

## 2023-05-12 ENCOUNTER — Ambulatory Visit: Payer: Medicare HMO | Admitting: Family Medicine

## 2023-05-24 ENCOUNTER — Encounter: Payer: Self-pay | Admitting: Family Medicine

## 2023-05-26 ENCOUNTER — Ambulatory Visit: Admitting: Family Medicine

## 2023-05-30 ENCOUNTER — Other Ambulatory Visit: Payer: Self-pay | Admitting: Family Medicine

## 2023-05-30 MED ORDER — MELOXICAM 15 MG PO TABS: 15.0000 mg | ORAL_TABLET | Freq: Every day | ORAL | 0 refills | Status: DC

## 2023-05-30 NOTE — Telephone Encounter (Signed)
 Last OV 02/04/23 Next OV not scheduled  Last refill 01/17/23 Qty # 30/0   Renal labs 04/18/23             Component Ref Range & Units (hover) 1 mo ago (04/18/23) 1 yr ago (04/15/22) 2 yr ago (04/13/21) 3 yr ago (02/26/20) 4 yr ago (07/06/18) 5 yr ago (12/09/17) 6 yr ago (11/30/16) 6 yr ago (11/30/16)  Sodium 140 140 141 139 141 141  143  Potassium 4.3 4.4 4.8 4.3 4.7 4.8  4.3  Chloride 104 104 104 103 102 103  104  CO2 29 28 31  32 30 31  31   Glucose, Bld 90 92 98 95 85 90  86  BUN 17 19 20 20 17 25  High   20  Creatinine, Ser 1.07 1.11 1.13 1.17 1.06 1.19  1.05  Total Bilirubin 0.5 0.6 0.6 0.5 0.5 0.6 0.4   Alkaline Phosphatase 36 Low  42 38 Low  41 50 37 Low  41   AST 13 17 17 15 14 17 17    ALT 12 14 16 16 17 16 17    Total Protein 6.1 7.1 6.8 6.6 6.7 7.2 6.5   Albumin 4.2 4.5 4.8 4.5 4.2 4.8 4.5   GFR 68.32 65.84 CM 64.90 CM 62.74 CM 69.10 64.38  74.60  Comment: Calculated using the CKD-EPI Creatinine Equation (2021)  Calcium 9.1 9.8 10.2 9.7 9.5 9.9  9.7

## 2023-06-03 ENCOUNTER — Encounter: Payer: Self-pay | Admitting: Family Medicine

## 2023-06-20 ENCOUNTER — Ambulatory Visit: Admitting: Family Medicine

## 2023-06-20 NOTE — Progress Notes (Signed)
 [facility] [address] [place] [address] Phone: [phone] Subjective:   Jacob [name], am serving as a scribe for Dr. Ronnell Coins.  I'm seeing this patient by the request  of:  Aida House, MD  CC: Back and neck pain follow-up multiple other small problems  [id number]  Jacob [name]. is a 73 y.o. male coming in with complaint of back and neck pain. OMT 03/03/2023. Pain in spine has been managable.   Also f/u for L hand ring finger triggering. Improving using arnica and voltaren and massaging palm with rubber ball. Does lock up if he is using hand with force like turning screw driver.   Patient states that on Monday morning he woke up with pain in the medial aspect of R foot. Drove on Sunday for 3 hours. Took Meloxicam  and used voltaren gel. Sore this morning but able to play golf yesterday. Wonders about custom orthotics.   Medications patient has been prescribed: Meloxicam   Taking: yes          Reviewed prior external information including notes and imaging from previsou exam, outside providers and external EMR if available.   As well as notes that were available from care everywhere and other healthcare systems.  Past medical history, social, surgical and family history all reviewed in electronic medical record.  No pertanent information unless stated regarding to the chief complaint.   Past Medical History:  Diagnosis Date   Allergy    SEASONAL   Arthritis    SHOULDERS   BPH associated with nocturia    Hyperlipidemia    "NOT ON MEDICATIONS"    Allergies  Allergen Reactions   Other Other (See Comments)    Pet dander, tree mold, ragweed, pollen cause sneezing and drainage     Review of Systems:  No headache, visual changes, nausea, vomiting, diarrhea, constipation, dizziness, abdominal pain, skin rash, fevers, chills, night sweats, weight loss, swollen lymph nodes, body aches, joint swelling, chest pain,  shortness of breath, mood changes. POSITIVE muscle aches  Objective  Blood pressure 120/82, pulse 62, height 5' 11.5" (1.816 m), weight 181 lb (82.1 kg), SpO2 98%.   General: No apparent distress alert and oriented x3 mood and affect normal, dressed appropriately.  HEENT: Pupils equal, extraocular movements intact  Respiratory: Patient's speak in full sentences and does not appear short of breath  Cardiovascular: No lower extremity edema, non tender, no erythema  Gait MSK:  Back  Foot exam does still have the breakdown of the transverse arch more than anything else.  Patient does have some tenderness to palpation over the midfoot on the right foot.  Osteopathic findings  T5 extended rotated and side bent right inhaled rib T11 extended rotated and side bent left L1 flexed rotated and side bent right Sacrum right on right     Assessment and Plan:  SI (sacroiliac) joint dysfunction Continue to have some difficulty.  Discussed icing regimen at home exercises, which activities to do and which ones to avoid.  Increase activity slowly.  Follow-up with me again in 6 to 8 weeks  Right foot pain Breakdown of the transverse arch, will try thicker over-the-counter insoles with an addition of a metatarsal pad.  Patient will see how this responds otherwise we will consider custom orthotics if needed further evaluation and treatment.    Nonallopathic problems  Decision today to treat with OMT was based on Physical Exam  After verbal consent patient was treated with HVLA,  ME, FPR techniques in  rib, thoracic, lumbar, and sacral  areas  Patient tolerated the procedure well with improvement in symptoms  Patient given exercises, stretches and lifestyle modifications  See medications in patient instructions if given  Patient will follow up in 4-8 weeks    The above documentation has been reviewed and is accurate and complete Jacob Margo, DO          Note: This dictation was  prepared with Dragon dictation along with smaller phrase technology. Any transcriptional errors that result from this process are unintentional.

## 2023-06-21 ENCOUNTER — Encounter: Payer: Self-pay | Admitting: Family Medicine

## 2023-06-21 ENCOUNTER — Ambulatory Visit: Admitting: Family Medicine

## 2023-06-21 ENCOUNTER — Other Ambulatory Visit: Payer: Self-pay

## 2023-06-21 VITALS — BP 120/82 | HR 62 | Ht 71.5 in | Wt 181.0 lb

## 2023-06-21 DIAGNOSIS — M533 Sacrococcygeal disorders, not elsewhere classified: Secondary | ICD-10-CM | POA: Diagnosis not present

## 2023-06-21 DIAGNOSIS — M9902 Segmental and somatic dysfunction of thoracic region: Secondary | ICD-10-CM

## 2023-06-21 DIAGNOSIS — M9903 Segmental and somatic dysfunction of lumbar region: Secondary | ICD-10-CM

## 2023-06-21 DIAGNOSIS — M79642 Pain in left hand: Secondary | ICD-10-CM

## 2023-06-21 DIAGNOSIS — M9904 Segmental and somatic dysfunction of sacral region: Secondary | ICD-10-CM | POA: Diagnosis not present

## 2023-06-21 DIAGNOSIS — M79671 Pain in right foot: Secondary | ICD-10-CM

## 2023-06-21 NOTE — Assessment & Plan Note (Signed)
 Breakdown of the transverse arch, will try thicker over-the-counter insoles with an addition of a metatarsal pad.  Patient will see how this responds otherwise we will consider custom orthotics if needed further evaluation and treatment.

## 2023-06-21 NOTE — Assessment & Plan Note (Signed)
 Continue to have some difficulty.  Discussed icing regimen at home exercises, which activities to do and which ones to avoid.  Increase activity slowly.  Follow-up with me again in 6 to 8 weeks

## 2023-06-21 NOTE — Patient Instructions (Signed)
 Hapad Original Spenco See me in 2-3 months

## 2023-07-28 NOTE — Progress Notes (Signed)
  [facility] [address] [place] [address] Phone: [phone] Subjective:   LILLETTE Claretha Schimke am a scribe for Dr. Claudene.  I'm seeing this patient by the request  of:  Ozell Heron HERO, MD  CC: Back and neck pain  YEP:Dlagzrupcz  Tushar Enns. is a 73 y.o. male coming in with complaint of back and neck pain. OMT 06/21/2023. Patient states that it is pretty good. Has not had any significant issues since the last visit. Wants to talk to Dr. Claudene about his trigger finger on the ring finger on the left hand,   Medications patient has been prescribed: Meloxicam   Taking:         Reviewed prior external information including notes and imaging from previsou exam, outside providers and external EMR if available.   As well as notes that were available from care everywhere and other healthcare systems.  Past medical history, social, surgical and family history all reviewed in electronic medical record.  No pertanent information unless stated regarding to the chief complaint.   Past Medical History:  Diagnosis Date   Allergy    SEASONAL   Arthritis    SHOULDERS   BPH associated with nocturia    Hyperlipidemia    NOT ON MEDICATIONS    Allergies  Allergen Reactions   Other Other (See Comments)    Pet dander, tree mold, ragweed, pollen cause sneezing and drainage     Review of Systems:  No headache, visual changes, nausea, vomiting, diarrhea, constipation, dizziness, abdominal pain, skin rash, fevers, chills, night sweats, weight loss, swollen lymph nodes, body aches, joint swelling, chest pain, shortness of breath, mood changes. POSITIVE muscle aches  Objective  Blood pressure 118/80, pulse 79, height 5' 11.5 (1.816 m), weight 173 lb 6.4 oz (78.7 kg), SpO2 98%.   General: No apparent distress alert and oriented x3 mood and affect normal, dressed appropriately.  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  Tenderness to palpation paraspinal musculature  Osteopathic findings  T6 extended rotated and side bent left L2 flexed rotated and side bent right L4 flexed rotated and side bent left Sacrum  left on left       Assessment and Plan:  No problem-specific Assessment & Plan notes found for this encounter.    Nonallopathic problems  Decision today to treat with OMT was based on Physical Exam  After verbal consent patient was treated with HVLA, ME, FPR techniques in thoracic, lumbar, and sacral  areas  Patient tolerated the procedure well with improvement in symptoms  Patient given exercises, stretches and lifestyle modifications  See medications in patient instructions if given  Patient will follow up in 4-8 weeks      The above documentation has been reviewed and is accurate and complete Ghina Bittinger M Kasen Adduci, DO        Note: This dictation was prepared with Dragon dictation along with smaller phrase technology. Any transcriptional errors that result from this process are unintentional.

## 2023-08-11 ENCOUNTER — Ambulatory Visit: Admitting: Family Medicine

## 2023-08-11 ENCOUNTER — Encounter: Payer: Self-pay | Admitting: Family Medicine

## 2023-08-11 VITALS — BP 118/80 | HR 79 | Ht 71.5 in | Wt 173.4 lb

## 2023-08-11 DIAGNOSIS — M9902 Segmental and somatic dysfunction of thoracic region: Secondary | ICD-10-CM | POA: Diagnosis not present

## 2023-08-11 DIAGNOSIS — M65342 Trigger finger, left ring finger: Secondary | ICD-10-CM | POA: Diagnosis not present

## 2023-08-11 DIAGNOSIS — M9903 Segmental and somatic dysfunction of lumbar region: Secondary | ICD-10-CM

## 2023-08-11 DIAGNOSIS — M533 Sacrococcygeal disorders, not elsewhere classified: Secondary | ICD-10-CM | POA: Diagnosis not present

## 2023-08-11 DIAGNOSIS — M9904 Segmental and somatic dysfunction of sacral region: Secondary | ICD-10-CM

## 2023-08-11 NOTE — Assessment & Plan Note (Signed)
 Worsening symptoms of the trigger finger but patient still wants to hold on any type of injection at this time.  Will continue to do mild immobilization.

## 2023-08-11 NOTE — Assessment & Plan Note (Signed)
 Sacroiliac dysfunction still noted.  Continue to monitor.  Continue to work on core strengthening.  Patient has been able to be active overall.  Will continue to monitor symptoms but follow-up again in 3 months.

## 2023-08-11 NOTE — Patient Instructions (Addendum)
 Jacob [name] Keep working on the trigger finger. See me in 3 months.

## 2023-08-30 ENCOUNTER — Encounter: Payer: Self-pay | Admitting: Family Medicine

## 2023-09-06 ENCOUNTER — Encounter: Payer: Self-pay | Admitting: Family Medicine

## 2023-09-06 ENCOUNTER — Other Ambulatory Visit: Payer: Self-pay

## 2023-09-06 ENCOUNTER — Ambulatory Visit: Admitting: Family Medicine

## 2023-09-06 VITALS — BP 122/84 | HR 69 | Ht 71.0 in | Wt 174.0 lb

## 2023-09-06 DIAGNOSIS — M9908 Segmental and somatic dysfunction of rib cage: Secondary | ICD-10-CM | POA: Diagnosis not present

## 2023-09-06 DIAGNOSIS — M9902 Segmental and somatic dysfunction of thoracic region: Secondary | ICD-10-CM

## 2023-09-06 DIAGNOSIS — M9904 Segmental and somatic dysfunction of sacral region: Secondary | ICD-10-CM | POA: Diagnosis not present

## 2023-09-06 DIAGNOSIS — M65342 Trigger finger, left ring finger: Secondary | ICD-10-CM | POA: Diagnosis not present

## 2023-09-06 DIAGNOSIS — M9903 Segmental and somatic dysfunction of lumbar region: Secondary | ICD-10-CM | POA: Diagnosis not present

## 2023-09-06 DIAGNOSIS — M9901 Segmental and somatic dysfunction of cervical region: Secondary | ICD-10-CM

## 2023-09-06 DIAGNOSIS — M533 Sacrococcygeal disorders, not elsewhere classified: Secondary | ICD-10-CM

## 2023-09-06 NOTE — Patient Instructions (Signed)
 Injection in for trigger finger today Good to see you! See you again in 6-8 weeks

## 2023-09-06 NOTE — Assessment & Plan Note (Signed)
 Injection given today and tolerated the procedure well.  Hopeful that this will be longevity and improvement in decreasing this.  Discussed potential bracing at night but I think patient will do well.

## 2023-09-06 NOTE — Assessment & Plan Note (Signed)
 Chronic problem we will continue to monitor, continue to work on core strengthening exercises.  Will continue to be stronger overall.  Increase activity slowly.

## 2023-09-06 NOTE — Progress Notes (Signed)
 [facility] [address] [place] [address] Phone: [phone] Subjective:   Jacob [name], am serving as a scribe for Dr. Arthea Claudene.  I'm seeing this patient by the request  of:  Jacob [name] HERO, MD  CC: back and neck and knee pain   YEP:Jacob  [name]. is a 73 y.o. male coming in with complaint of back and neck pain. OMT on 08/11/2023. Patient states doing well. Trigger finger L hand 4th finger. Thinks maybe its time for an injection. No other concerns.  Medications patient has been prescribed:   Taking:         Reviewed prior external information including notes and imaging from previsou exam, outside providers and external EMR if available.   As well as notes that were available from care everywhere and other healthcare systems.  Past medical history, social, surgical and family history all reviewed in electronic medical record.  No pertanent information unless stated regarding to the chief complaint.   Past Medical History:  Diagnosis Date   Allergy    SEASONAL   Arthritis    SHOULDERS   BPH associated with nocturia    Hyperlipidemia    NOT ON MEDICATIONS    Allergies  Allergen Reactions   Other Other (See Comments)    Pet dander, tree mold, ragweed, pollen cause sneezing and drainage     Review of Systems:  No headache, visual changes, nausea, vomiting, diarrhea, constipation, dizziness, abdominal pain, skin rash, fevers, chills, night sweats, weight loss, swollen lymph nodes, body aches, joint swelling, chest pain, shortness of breath, mood changes. POSITIVE muscle aches  Objective  Blood pressure 122/84, pulse 69, height 5' 11 (1.803 m), weight 174 lb (78.9 kg), SpO2 98%.   General: No apparent distress alert and oriented x3 mood and affect normal, dressed appropriately.  HEENT: Pupils equal, extraocular movements intact  Respiratory: Patient's speak in full sentences and does not appear short  of breath  Cardiovascular: No lower extremity edema, non tender, no erythema  Gait MSK:  Back neck does have some tightness noted.  Neck exam does have some loss lordosis noted to.  Overall does have some tightness but nothing severe.   Osteopathic findings  C3 flexed rotated and side bent right C7 flexed rotated and side bent left T3 extended rotated and side bent right inhaled rib T1 extended rotated and side bent left L2 flexed rotated and side bent right Sacrum right on right   Procedure: Real-time Ultrasound Guided Injection of left ring flexor tendon sheath Device: GE Logiq Q7 Ultrasound guided injection is preferred based studies that show increased duration, increased effect, greater accuracy, decreased procedural pain, increased response rate, and decreased cost with ultrasound guided versus blind injection.  Verbal informed consent obtained.  Time-out conducted.  Noted no overlying erythema, induration, or other signs of local infection.  Skin prepped in a sterile fashion.  Local anesthesia: Topical Ethyl chloride.  With sterile technique and under real time ultrasound guidance: With a 25-gauge half inch needle given 0.5 cc of 0.5% Marcaine and 0.5 cc of Kenalog  40 mg/mL Completed without difficulty  Pain immediately resolved suggesting accurate placement of the medication.  Images saved Advised to call if fevers/chills, erythema, induration, drainage, or persistent bleeding.  Impression: Technically successful ultrasound guided injection.    Assessment and Plan:  Acquired trigger finger of left ring finger Injection given today and tolerated the procedure well.  Hopeful that this will be longevity and improvement  in decreasing this.  Discussed potential bracing at night but I think patient will do well.  SI (sacroiliac) joint dysfunction Chronic problem we will continue to monitor, continue to work on core strengthening exercises.  Will continue to be stronger  overall.  Increase activity slowly.    Nonallopathic problems  Decision today to treat with OMT was based on Physical Exam  After verbal consent patient was treated with HVLA, ME, FPR techniques in cervical, rib, thoracic, lumbar, and sacral  areas  Patient tolerated the procedure well with improvement in symptoms  Patient given exercises, stretches and lifestyle modifications  See medications in patient instructions if given  Patient will follow up in 4-8 weeks     The above documentation has been reviewed and is accurate and complete Jacob [name] M Jacob Posa, DO         Note: This dictation was prepared with Dragon dictation along with smaller phrase technology. Any transcriptional errors that result from this process are unintentional.

## 2023-09-29 DIAGNOSIS — D1801 Hemangioma of skin and subcutaneous tissue: Secondary | ICD-10-CM | POA: Diagnosis not present

## 2023-09-29 DIAGNOSIS — L309 Dermatitis, unspecified: Secondary | ICD-10-CM | POA: Diagnosis not present

## 2023-09-29 DIAGNOSIS — L814 Other melanin hyperpigmentation: Secondary | ICD-10-CM | POA: Diagnosis not present

## 2023-09-29 DIAGNOSIS — L821 Other seborrheic keratosis: Secondary | ICD-10-CM | POA: Diagnosis not present

## 2023-09-29 DIAGNOSIS — L570 Actinic keratosis: Secondary | ICD-10-CM | POA: Diagnosis not present

## 2023-10-18 NOTE — Progress Notes (Unsigned)
 [facility] [address] [place] [address] Phone: [phone] Subjective:   ISusannah Gully, am serving as a scribe for Dr. Arthea Claudene.  I'm seeing this patient by the request  of:  Ozell Heron HERO, MD  CC: back and neck pain follow up   YEP:Dlagzrupcz  Chapman Matteucci. is a 73 y.o. male coming in with complaint of back and neck pain. OMT 09/06/2023. Also f/u for L ring finger, trigger finger. Patient states  fingers are going okay. Pain is gone. Triggering maybe once a week at night. L ankle pain, started this past Sunday, golfing. Pain on lateral ankle. Has taken meloxicam , used topical. Feels pain the most in plantarflexion.  Medications patient has been prescribed: Meloxicam   Taking:         Reviewed prior external information including notes and imaging from previsou exam, outside providers and external EMR if available.   As well as notes that were available from care everywhere and other healthcare systems.  Past medical history, social, surgical and family history all reviewed in electronic medical record.  No pertanent information unless stated regarding to the chief complaint.   Past Medical History:  Diagnosis Date   Allergy    SEASONAL   Arthritis    SHOULDERS   BPH associated with nocturia    Hyperlipidemia    NOT ON MEDICATIONS    Allergies  Allergen Reactions   Other Other (See Comments)    Pet dander, tree mold, ragweed, pollen cause sneezing and drainage     Review of Systems:  No headache, visual changes, nausea, vomiting, diarrhea, constipation, dizziness, abdominal pain, skin rash, fevers, chills, night sweats, weight loss, swollen lymph nodes, body aches, joint swelling, chest pain, shortness of breath, mood changes. POSITIVE muscle aches  Objective  Blood pressure 122/86, pulse 74, height 5' 11 (1.803 m), weight 178 lb (80.7 kg), SpO2 97%.   General: No apparent distress alert and oriented x3 mood  and affect normal, dressed appropriately.  HEENT: Pupils equal, extraocular movements intact  Respiratory: Patient's speak in full sentences and does not appear short of breath  Cardiovascular: No lower extremity edema, non tender, no erythema  Left ankle shows the patient does have swelling noted posteriorly to the lateral malleolus.  Patient has some tenderness over the fifth metatarsal proximally.  Relatively good range of motion of the ankle still noted.  Does have the overpronation of the hindfoot noted.  Limited muscular skeletal ultrasound was performed and interpreted by CLAUDENE ARTHEA, M  Limited ultrasound does show the patient does have hypoechoic changes within the tendon sheath and appears to have some hyperechoic changes consistent with some dried blood around the peroneal tendon.  Partial tearing noted with mild gapping just inferior and posterior to the lateral malleolus.  Patient also has an avulsion off of the 4th and 5th metatarsals proximately.  Appears to be acute with increasing in neovascularization and Doppler flow.      Assessment and Plan:  Peroneal tendon injury, left, initial encounter Unfortunately partial tearing noted with a very small avulsion noted at the fourth fifth metatarsal proximally.  Discussed with patient about rigid soled shoes, discussed with patient about icing regimen, with patient traveling will try to do this with an Aircast and set up a cam walker at the moment.  Patient notes worsening pain to seek medical attention.  Follow-up again in 4 to 6 weeks otherwise.     The above documentation has been reviewed  and is accurate and complete Asiah Befort M Jessiah Wojnar, DO          Note: This dictation was prepared with Dragon dictation along with smaller phrase technology. Any transcriptional errors that result from this process are unintentional.

## 2023-10-19 ENCOUNTER — Other Ambulatory Visit: Payer: Self-pay

## 2023-10-19 ENCOUNTER — Encounter: Payer: Self-pay | Admitting: Family Medicine

## 2023-10-19 ENCOUNTER — Ambulatory Visit: Admitting: Family Medicine

## 2023-10-19 VITALS — BP 122/86 | HR 74 | Ht 71.0 in | Wt 178.0 lb

## 2023-10-19 DIAGNOSIS — M25572 Pain in left ankle and joints of left foot: Secondary | ICD-10-CM | POA: Diagnosis not present

## 2023-10-19 DIAGNOSIS — S86302A Unspecified injury of muscle(s) and tendon(s) of peroneal muscle group at lower leg level, left leg, initial encounter: Secondary | ICD-10-CM

## 2023-10-19 DIAGNOSIS — S92342A Displaced fracture of fourth metatarsal bone, left foot, initial encounter for closed fracture: Secondary | ICD-10-CM | POA: Diagnosis not present

## 2023-10-19 MED ORDER — VITAMIN D (ERGOCALCIFEROL) 1.25 MG (50000 UNIT) PO CAPS: 50000.0000 [IU] | ORAL_CAPSULE | ORAL | 0 refills | Status: AC

## 2023-10-19 NOTE — Patient Instructions (Signed)
 You have 14 days to return or exchange your brace Call [phone], then return the brace to our office [facility] See you again in 4-6 weeks

## 2023-10-19 NOTE — Assessment & Plan Note (Addendum)
 Unfortunately partial tearing noted with a very small avulsion noted at the fourth fifth metatarsal proximally.  Discussed with patient about rigid soled shoes, discussed with patient about icing regimen, with patient traveling will try to do this with an Aircast and set up a cam walker at the moment.  Patient notes worsening pain to seek medical attention.  Follow-up again in 4 to 6 weeks otherwise.  Once weekly vitamin D  prescribed as well.

## 2023-10-30 ENCOUNTER — Encounter: Payer: Self-pay | Admitting: Family Medicine

## 2023-11-01 ENCOUNTER — Other Ambulatory Visit: Payer: Self-pay

## 2023-11-01 ENCOUNTER — Ambulatory Visit: Admitting: Family Medicine

## 2023-11-01 ENCOUNTER — Encounter: Payer: Self-pay | Admitting: Family Medicine

## 2023-11-01 VITALS — BP 126/84 | HR 65 | Ht 71.0 in

## 2023-11-01 DIAGNOSIS — M10472 Other secondary gout, left ankle and foot: Secondary | ICD-10-CM

## 2023-11-01 DIAGNOSIS — M79672 Pain in left foot: Secondary | ICD-10-CM

## 2023-11-01 DIAGNOSIS — M109 Gout, unspecified: Secondary | ICD-10-CM | POA: Insufficient documentation

## 2023-11-01 MED ORDER — MELOXICAM 15 MG PO TABS: 15.0000 mg | ORAL_TABLET | Freq: Every day | ORAL | 0 refills | Status: AC

## 2023-11-01 NOTE — Assessment & Plan Note (Signed)
 Patient has a gap noted.  Discussed with surgeon and home exercises, discussed which activities to do and which ones to avoid, discussed diet changes.  Discussed tart cherry and would recommend allopurinol if any other breakthrough.  Follow-up with me again as scheduled for other ailments

## 2023-11-01 NOTE — Progress Notes (Signed)
 [name] [address] [place] [address] Phone: [phone] Subjective:   Jacob [name], am serving as a scribe for Dr. Arthea Jacob.  I'm seeing this patient by the request  of:  Jacob Heron HERO, MD  CC: Large left toe pain   YEP:Jacob [name]  Jacob [name]. is a 73 y.o. male coming in with complaint of L foot pain. Started swell 9/28 and was red with radiating pain. Tender over joint in 1st toe. Has been icing at home.       Past Medical History:  Diagnosis Date   Allergy    SEASONAL   Arthritis    SHOULDERS   BPH associated with nocturia    Hyperlipidemia    NOT ON MEDICATIONS   Past Surgical History:  Procedure Laterality Date   APPENDECTOMY  1973   COLONOSCOPY     KNEE ARTHROSCOPY Left    x2   ROTATOR CUFF REPAIR Bilateral    Social History   Socioeconomic History   Marital status: Married    Spouse name: Not on file   Number of children: Not on file   Years of education: Not on file   Highest education level: Not on file  Occupational History   Not on file  Tobacco Use   Smoking status: Former    Current packs/day: 0.00    Types: Cigarettes    Quit date: 1971    Years since quitting: 54.7   Smokeless tobacco: Never  Vaping Use   Vaping status: Never Used  Substance and Sexual Activity   Alcohol use: Yes    Alcohol/week: 7.0 standard drinks of alcohol    Types: 7 Standard drinks or equivalent per week    Comment: 2 glasses wine nightly; 4 shot liquor   Drug use: No   Sexual activity: Not Currently  Other Topics Concern   Not on file  Social History Narrative   Not on file   Social Drivers of Health   Financial Resource Strain: Low Risk  (04/18/2023)   Overall Financial Resource Strain (CARDIA)    Difficulty of Paying Living Expenses: Not hard at all  Food Insecurity: No Food Insecurity (04/18/2023)   Hunger Vital Sign    Worried About Running Out of Food in the Last Year: Never true    Ran  Out of Food in the Last Year: Never true  Transportation Needs: No Transportation Needs (04/18/2023)   PRAPARE - Administrator, Civil Service (Medical): No    Lack of Transportation (Non-Medical): No  Physical Activity: Sufficiently Active (04/18/2023)   Exercise Vital Sign    Days of Exercise per Week: 7 days    Minutes of Exercise per Session: 30 min  Stress: No Stress Concern Present (04/18/2023)   [name] of Occupational Health - Occupational Stress Questionnaire    Feeling of Stress : Not at all  Social Connections: Socially Integrated (04/18/2023)   Social Connection and Isolation Panel    Frequency of Communication with Friends and Family: More than three times a week    Frequency of Social Gatherings with Friends and Family: Not on file    Attends Religious Services: More than 4 times per year    Active Member of [employer] or Organizations: Yes    Attends Banker Meetings: More than 4 times per year    Marital Status: Married   Allergies  Allergen Reactions   Other Other (See Comments)    Pet  dander, tree mold, ragweed, pollen cause sneezing and drainage   Family History  Problem Relation Age of Onset   Sarcoidosis Mother        lung   Colon polyps Father    Heart failure Father 63   Rheum arthritis Father    Gout Brother    Heart attack Maternal Grandfather 55   Other Paternal Grandfather 72   Diabetes Neg Hx    Colon cancer Neg Hx    Crohn's disease Neg Hx    Esophageal cancer Neg Hx    Rectal cancer Neg Hx    Stomach cancer Neg Hx    Ulcerative colitis Neg Hx       Current Outpatient Medications (Respiratory):    Fluticasone Propionate (FLONASE ALLERGY RELIEF NA), Place into the nose as needed.  Current Outpatient Medications (Analgesics):    meloxicam  (MOBIC ) 15 MG tablet, Take 1 tablet (15 mg total) by mouth daily.  Current Outpatient Medications (Hematological):    Cyanocobalamin  (B-12 PO), Take 1,000 mcg by mouth 3  (three) times a week.  Current Outpatient Medications (Other):    Multiple Vitamin (MULTIVITAMIN ADULT PO), Take by mouth daily.   Probiotic Product (PROBIOTIC PO), Take by mouth daily.   Vitamin D , Ergocalciferol , (DRISDOL ) 1.25 MG (50000 UNIT) CAPS capsule, Take 1 capsule (50,000 Units total) by mouth every 7 (seven) days.   Reviewed prior external information including notes and imaging from  primary care provider As well as notes that were available from care everywhere and other healthcare systems.  Past medical history, social, surgical and family history all reviewed in electronic medical record.  No pertanent information unless stated regarding to the chief complaint.   Review of Systems:  No headache, visual changes, nausea, vomiting, diarrhea, constipation, dizziness, abdominal pain, skin rash, fevers, chills, night sweats, weight loss, swollen lymph nodes, body aches, joint swelling, chest pain, shortness of breath, mood changes. POSITIVE muscle aches  Objective  Blood pressure 126/84, pulse 65, height 5' 11 (1.803 m), SpO2 98%.   General: No apparent distress alert and oriented x3 mood and affect normal, dressed appropriately.  HEENT: Pupils equal, extraocular movements intact  Respiratory: Patient's speak in full sentences and does not appear short of breath  Cardiovascular: No lower extremity edema, non tender, no erythema  Toe exam shows patient does have some redness noted of the left toe.  No sign of any type of skin breakdown.  Limited range of motion secondary to some voluntary guarding as well as the swelling.  Limited muscular skeletal ultrasound was performed and interpreted by Jacob [name], M  Limited ultrasound shows that there is some calcific deposits consistent with a dental line with potential gout.  Seems to have a resolving effusion of the joint space noted. Impression: Likely resolving gout flare   Impression and Recommendations:    The above  documentation has been reviewed and is accurate and complete Jacob Polo M Ruby Logiudice, DO

## 2023-11-01 NOTE — Patient Instructions (Signed)
 Tart cherry 3600mg  daily Keep doing all the other things Check out the diet to see if any changes can be made See you at next appointment

## 2023-11-10 ENCOUNTER — Ambulatory Visit: Admitting: Family Medicine

## 2023-12-06 NOTE — Progress Notes (Unsigned)
  [facility] [address] [place] [address] Phone: [phone] Subjective:   ISusannah Gully, am serving as a scribe for Dr. Arthea Claudene.  I'm seeing this patient by the request  of:  Ozell Heron HERO, MD  CC: Back and neck pain  YEP:Dlagzrupcz  Gavynn Duvall. is a 73 y.o. male coming in with complaint of back and neck pain. Last seen for gout in L foot in September.  Patient wanted to see if he could treat the gout conservatively.  Patient states discomfort when hitting golf balls with shifting weight to L foot. Okay with ADL.   Medications patient has been prescribed: Meloxicam , Vit D  Taking:         Reviewed prior external information including notes and imaging from previsou exam, outside providers and external EMR if available.   As well as notes that were available from care everywhere and other healthcare systems.  Past medical history, social, surgical and family history all reviewed in electronic medical record.  No pertanent information unless stated regarding to the chief complaint.   Past Medical History:  Diagnosis Date   Allergy    SEASONAL   Arthritis    SHOULDERS   BPH associated with nocturia    Hyperlipidemia    NOT ON MEDICATIONS    Allergies  Allergen Reactions   Other Other (See Comments)    Pet dander, tree mold, ragweed, pollen cause sneezing and drainage     Review of Systems:  No headache, visual changes, nausea, vomiting, diarrhea, constipation, dizziness, abdominal pain, skin rash, fevers, chills, night sweats, weight loss, swollen lymph nodes, body aches, joint swelling, chest pain, shortness of breath, mood changes. POSITIVE muscle aches  Objective  Blood pressure 126/82, pulse 74, height 5' 11 (1.803 m), weight 178 lb (80.7 kg), SpO2 99%.   General: No apparent distress alert and oriented x3 mood and affect normal, dressed appropriately.  HEENT: Pupils equal, extraocular movements intact   Respiratory: Patient's speak in full sentences and does not appear short of breath  Cardiovascular: No lower extremity edema, non tender, no erythema  Still some tenderness over the fifth metatarsal.  First metatarsal does have some limited dorsiflexion but otherwise fairly unremarkable.   Limited muscular skeletal ultrasound was performed and interpreted by CLAUDENE ARTHEA, M  Limited ultrasound shows good callus formation just proximal to the fifth metatarsal near the fourth metatarsal.  Tendon appears to have good healing aspect noted.    Assessment and Plan:  Peroneal tendon injury, left, initial encounter Significant improvement at this point.  Can start increasing activity.  Discussed icing regimen.  Follow-up again in 6 to 12 weeks  Acute gout of left foot Completely resolved at this time but still with some mild effusion noted to the first MTP that is more likely secondary to chronic arthritic changes noted.  Increase activity slowly.  Follow-up again in 6 to 12 weeks.         The above documentation has been reviewed and is accurate and complete Khiley Lieser M Tayler Lassen, DO        Note: This dictation was prepared with Dragon dictation along with smaller phrase technology. Any transcriptional errors that result from this process are unintentional.

## 2023-12-08 ENCOUNTER — Other Ambulatory Visit: Payer: Self-pay

## 2023-12-08 ENCOUNTER — Ambulatory Visit: Admitting: Family Medicine

## 2023-12-08 ENCOUNTER — Encounter: Payer: Self-pay | Admitting: Family Medicine

## 2023-12-08 VITALS — BP 126/82 | HR 74 | Ht 71.0 in | Wt 178.0 lb

## 2023-12-08 DIAGNOSIS — M10472 Other secondary gout, left ankle and foot: Secondary | ICD-10-CM

## 2023-12-08 DIAGNOSIS — H5202 Hypermetropia, left eye: Secondary | ICD-10-CM | POA: Diagnosis not present

## 2023-12-08 DIAGNOSIS — M79672 Pain in left foot: Secondary | ICD-10-CM | POA: Diagnosis not present

## 2023-12-08 DIAGNOSIS — S86302A Unspecified injury of muscle(s) and tendon(s) of peroneal muscle group at lower leg level, left leg, initial encounter: Secondary | ICD-10-CM | POA: Diagnosis not present

## 2023-12-08 NOTE — Assessment & Plan Note (Signed)
 Significant improvement at this point.  Can start increasing activity.  Discussed icing regimen.  Follow-up again in 6 to 12 weeks

## 2023-12-08 NOTE — Patient Instructions (Signed)
 Good to see you! See you again in 2 months

## 2023-12-08 NOTE — Assessment & Plan Note (Signed)
 Completely resolved at this time but still with some mild effusion noted to the first MTP that is more likely secondary to chronic arthritic changes noted.  Increase activity slowly.  Follow-up again in 6 to 12 weeks.

## 2024-01-10 NOTE — Progress Notes (Unsigned)
 [name] [address] [place] [address] Phone: [phone] Subjective:    I'm seeing this patient by the request  of:  Ozell Heron HERO, MD  CC:   YEP:Dlagzrupcz  12/08/2023 Significant improvement at this point.  Can start increasing activity.  Discussed icing regimen.  Follow-up again in 6 to 12 weeks     Completely resolved at this time but still with some mild effusion noted to the first MTP that is more likely secondary to chronic arthritic changes noted.  Increase activity slowly.  Follow-up again in 6 to 12 weeks.     Updated 01/12/2024 Jacob Jennelle Raddle. is a 73 y.o. male coming in with complaint of L foot pain       Past Medical History:  Diagnosis Date   Allergy    SEASONAL   Arthritis    SHOULDERS   BPH associated with nocturia    Hyperlipidemia    NOT ON MEDICATIONS   Past Surgical History:  Procedure Laterality Date   APPENDECTOMY  1973   COLONOSCOPY     KNEE ARTHROSCOPY Left    x2   ROTATOR CUFF REPAIR Bilateral    Social History   Socioeconomic History   Marital status: Married    Spouse name: Not on file   Number of children: Not on file   Years of education: Not on file   Highest education level: Not on file  Occupational History   Not on file  Tobacco Use   Smoking status: Former    Current packs/day: 0.00    Types: Cigarettes    Quit date: 1971    Years since quitting: 54.9   Smokeless tobacco: Never  Vaping Use   Vaping status: Never Used  Substance and Sexual Activity   Alcohol use: Yes    Alcohol/week: 7.0 standard drinks of alcohol    Types: 7 Standard drinks or equivalent per week    Comment: 2 glasses wine nightly; 4 shot liquor   Drug use: No   Sexual activity: Not Currently  Other Topics Concern   Not on file  Social History Narrative   Not on file   Social Drivers of Health   Financial Resource Strain: Low Risk  (04/18/2023)   Overall Financial Resource Strain (CARDIA)     Difficulty of Paying Living Expenses: Not hard at all  Food Insecurity: No Food Insecurity (04/18/2023)   Hunger Vital Sign    Worried About Running Out of Food in the Last Year: Never true    Ran Out of Food in the Last Year: Never true  Transportation Needs: No Transportation Needs (04/18/2023)   PRAPARE - Administrator, Civil Service (Medical): No    Lack of Transportation (Non-Medical): No  Physical Activity: Sufficiently Active (04/18/2023)   Exercise Vital Sign    Days of Exercise per Week: 7 days    Minutes of Exercise per Session: 30 min  Stress: No Stress Concern Present (04/18/2023)   [name]    Feeling of Stress : Not at all  Social Connections: Socially Integrated (04/18/2023)   Social Connection and Isolation Panel    Frequency of Communication with Friends and Family: More than three times a week    Frequency of Social Gatherings with Friends and Family: Not on file    Attends Religious Services: More than 4 times per year    Active Member of Clubs or Organizations: Yes  Attends Engineer, Structural: More than 4 times per year    Marital Status: Married   Allergies  Allergen Reactions   Other Other (See Comments)    Pet dander, tree mold, ragweed, pollen cause sneezing and drainage   Family History  Problem Relation Age of Onset   Sarcoidosis Mother        lung   Colon polyps Father    Heart failure Father 72   Rheum arthritis Father    Gout Brother    Heart attack Maternal Grandfather 66   Other Paternal Grandfather 28   Diabetes Neg Hx    Colon cancer Neg Hx    Crohn's disease Neg Hx    Esophageal cancer Neg Hx    Rectal cancer Neg Hx    Stomach cancer Neg Hx    Ulcerative colitis Neg Hx       Current Outpatient Medications (Respiratory):    Fluticasone Propionate (FLONASE ALLERGY RELIEF NA), Place into the nose as needed.  Current Outpatient Medications  (Analgesics):    meloxicam  (MOBIC ) 15 MG tablet, Take 1 tablet (15 mg total) by mouth daily.  Current Outpatient Medications (Hematological):    Cyanocobalamin  (B-12 PO), Take 1,000 mcg by mouth 3 (three) times a week.  Current Outpatient Medications (Other):    Multiple Vitamin (MULTIVITAMIN ADULT PO), Take by mouth daily.   Probiotic Product (PROBIOTIC PO), Take by mouth daily.   Vitamin D , Ergocalciferol , (DRISDOL ) 1.25 MG (50000 UNIT) CAPS capsule, Take 1 capsule (50,000 Units total) by mouth every 7 (seven) days.   Reviewed prior external information including notes and imaging from  primary care provider As well as notes that were available from care everywhere and other healthcare systems.  Past medical history, social, surgical and family history all reviewed in electronic medical record.  No pertanent information unless stated regarding to the chief complaint.   Review of Systems:  No headache, visual changes, nausea, vomiting, diarrhea, constipation, dizziness, abdominal pain, skin rash, fevers, chills, night sweats, weight loss, swollen lymph nodes, body aches, joint swelling, chest pain, shortness of breath, mood changes. POSITIVE muscle aches  Objective  There were no vitals taken for this visit.   General: No apparent distress alert and oriented x3 mood and affect normal, dressed appropriately.  HEENT: Pupils equal, extraocular movements intact  Respiratory: Patient's speak in full sentences and does not appear short of breath  Cardiovascular: No lower extremity edema, non tender, no erythema      Impression and Recommendations:

## 2024-01-12 ENCOUNTER — Ambulatory Visit: Admitting: Family Medicine

## 2024-01-12 ENCOUNTER — Encounter: Payer: Self-pay | Admitting: Family Medicine

## 2024-01-12 VITALS — Ht 71.0 in | Wt 179.0 lb

## 2024-01-12 DIAGNOSIS — M533 Sacrococcygeal disorders, not elsewhere classified: Secondary | ICD-10-CM

## 2024-01-12 DIAGNOSIS — M9904 Segmental and somatic dysfunction of sacral region: Secondary | ICD-10-CM

## 2024-01-12 DIAGNOSIS — M9903 Segmental and somatic dysfunction of lumbar region: Secondary | ICD-10-CM | POA: Diagnosis not present

## 2024-01-12 DIAGNOSIS — M9902 Segmental and somatic dysfunction of thoracic region: Secondary | ICD-10-CM | POA: Diagnosis not present

## 2024-01-12 NOTE — Assessment & Plan Note (Signed)
 Chronic problem with some tightness.  Responded extremely well though to osteopathic manipulation.  Discussed which activities to do and which ones to avoid.  Increase activity slowly.  Follow-up again in 6 to 12 weeks

## 2024-01-21 ENCOUNTER — Encounter: Payer: Self-pay | Admitting: Family Medicine

## 2024-01-22 ENCOUNTER — Emergency Department (HOSPITAL_BASED_OUTPATIENT_CLINIC_OR_DEPARTMENT_OTHER)
Admission: EM | Admit: 2024-01-22 | Discharge: 2024-01-22 | Disposition: A | Discharge status: Home / Self Care | Attending: Emergency Medicine | Admitting: Emergency Medicine

## 2024-01-22 ENCOUNTER — Other Ambulatory Visit: Payer: Self-pay

## 2024-01-22 DIAGNOSIS — M5459 Other low back pain: Secondary | ICD-10-CM | POA: Diagnosis not present

## 2024-01-22 DIAGNOSIS — M545 Low back pain, unspecified: Secondary | ICD-10-CM | POA: Diagnosis present

## 2024-01-22 MED ORDER — METHYLPREDNISOLONE 4 MG PO TBPK: ORAL_TABLET | ORAL | 0 refills | Status: AC

## 2024-01-22 MED ORDER — CYCLOBENZAPRINE HCL 10 MG PO TABS: 10.0000 mg | ORAL_TABLET | Freq: Every evening | ORAL | 0 refills | Status: AC | PRN

## 2024-01-22 MED ORDER — DEXAMETHASONE SOD PHOSPHATE PF 10 MG/ML IJ SOLN
10.0000 mg | Freq: Once | INTRAMUSCULAR | Status: AC
  Administered 2024-01-22: 10 mg via INTRAMUSCULAR

## 2024-01-22 NOTE — ED Triage Notes (Signed)
 Reports lower back x 1 days. Hx of spinal misalignment Ambulatory. States pain is worse with movement.

## 2024-01-22 NOTE — Discharge Instructions (Signed)
 Please read and follow all provided instructions.  Your diagnoses today include:  1. Acute left-sided low back pain without sciatica     Tests performed today include: Vital signs - see below for your results today  Medications prescribed:  Medrol  Dosepak  Flexeril  (cyclobenzaprine ) - muscle relaxer medication  DO NOT drive or perform any activities that require you to be awake and alert because this medicine can make you drowsy.   Take any prescribed medications only as directed.  Home care instructions:  Follow any educational materials contained in this packet Please rest, use ice or heat on your back for the next several days Do not lift, push, pull anything more than 10 pounds for the next week  Follow-up instructions: Please follow-up with your primary care provider in the next 1 week for further evaluation of your symptoms.   Return instructions:  SEEK IMMEDIATE MEDICAL ATTENTION IF YOU HAVE: New numbness, tingling, weakness, or problem with the use of your arms or legs Severe back pain not relieved with medications Loss control of your bowels or bladder Increasing pain in any areas of the body (such as chest or abdominal pain) Shortness of breath, dizziness, or fainting.  Worsening nausea (feeling sick to your stomach), vomiting, fever, or sweats Any other emergent concerns regarding your health   Additional Information:  Your vital signs today were: BP (!) 146/92 (BP Location: Left Arm)   Pulse 62   Temp 98.3 F (36.8 C) (Oral)   Resp 18   SpO2 100%  If your blood pressure (BP) was elevated above 135/85 this visit, please have this repeated by your doctor within one month. --------------

## 2024-01-22 NOTE — ED Provider Notes (Signed)
 " [place] [facility] AT [facility] Provider Note   CSN: [id number] Arrival date & time: 01/22/24  1002     Patient presents with: Back Pain   Jacob [name]. is a 73 y.o. male.   Patient with history of low back pains, he reports due to lower back and pelvic alignment problems, followed by Dr. Claudene with sports medicine --presents to the emergency department with about 3 days of worsening left lower back pain.  Pain is worsened by certain positions.  When the pain catches him, it causes his legs to buckle.  Typically when patient gets similar symptoms, he will apply topical medication, take home meloxicam  prescribed by his doctor, do stretches and use heat.  This seemed to be less effective yesterday.  He used diclofenac gel last night which helped him sleep but pain still present this morning. Patient denies warning symptoms of back pain including: fecal incontinence, urinary retention or overflow incontinence, night sweats, waking from sleep with back pain, unexplained fevers or weight loss, h/o cancer, IVDU, recent trauma.  States that he has taken meloxicam  over the past 4 days.  Reports that he will be traveling to [place]  in the next few days and staying in a hotel room.       Prior to Admission medications  Medication Sig Start Date End Date Taking? Authorizing Provider  Cyanocobalamin  (B-12 PO) Take 1,000 mcg by mouth 3 (three) times a week.    [provider]  Fluticasone Propionate (FLONASE ALLERGY RELIEF NA) Place into the nose as needed.    [provider]  meloxicam  (MOBIC ) 15 MG tablet Take 1 tablet (15 mg total) by mouth daily. 11/01/23   Smith, Zachary M, DO  Multiple Vitamin (MULTIVITAMIN ADULT PO) Take by mouth daily.    [provider]  Probiotic Product (PROBIOTIC PO) Take by mouth daily.    [provider]  Vitamin D , Ergocalciferol , (DRISDOL ) 1.25 MG (50000 UNIT) CAPS capsule Take 1 capsule (50,000 Units  total) by mouth every 7 (seven) days. 10/19/23   Smith, Zachary M, DO    Allergies: Other    Review of Systems  Updated Vital Signs BP (!) 146/92 (BP Location: Left Arm)   Pulse 62   Temp 98.3 F (36.8 C) (Oral)   Resp 18   SpO2 100%   Physical Exam Vitals and nursing note reviewed.  Constitutional:      Appearance: He is well-developed.  HENT:     Head: Normocephalic and atraumatic.  Eyes:     Conjunctiva/sclera: Conjunctivae normal.  Abdominal:     Palpations: Abdomen is soft.     Tenderness: There is no abdominal tenderness. There is no right CVA tenderness or left CVA tenderness.     Comments: Anterior abdomen soft and nontender  Musculoskeletal:        General: No tenderness. Normal range of motion.     Cervical back: Normal range of motion. No tenderness.     Thoracic back: No tenderness.       Back:     Comments: No step-off noted with palpation of spine.  Patient has some discomfort going from a seated position to lying in the bed.  He is able to lift legs off of the bed against gravity.  Skin:    General: Skin is warm and dry.  Neurological:     Mental Status: He is alert.     Sensory: No sensory deficit.     Motor: No abnormal muscle  tone.     Comments: 5/5 strength in entire lower extremities bilaterally. No sensation deficit.   Psychiatric:        Mood and Affect: Mood normal.     (all labs ordered are listed, but only abnormal results are displayed) Labs Reviewed - No data to display  EKG: None  Radiology: No results found.   Procedures   Medications Ordered in the ED  dexamethasone  (DECADRON ) injection 10 mg (has no administration in time range)   ED Course  Patient seen and examined. History obtained directly from patient.    Labs/EKG: None ordered.  Imaging: None ordered. Considering imaging of the lumbar spine with x-ray or CT but no red flags today and feel this would be low yield.   Medications/Fluids: Ordered IM Decadron .  Most  recent vital signs reviewed and are as follows: BP (!) 146/92 (BP Location: Left Arm)   Pulse 62   Temp 98.3 F (36.8 C) (Oral)   Resp 18   SpO2 100%   Initial impression: Low back pain without red flags  Home treatment plan: Patient was counseled on back pain precautions and advised to do activity as tolerated but avoid strenuous activity and do not lift, push, or pull heavy objects more than 10 pounds for the next week.  Patient counseled to use ice or heat on back as needed for pain and spasm.    Medications prescribed: Flexeril  for muscle pain and spasm to use at bedtime. Patient counseled on proper use of muscle relaxant medication.  They were requested not to drink alcohol, drive any vehicle, or do any dangerous activities while taking this medication due to potential drowsiness and unintended.  Patient verbalized understanding.  Medrol  Dosepak.  Return instructions discussed with patient: Urged to return with worsening severe pain, loss of bowel or bladder control, trouble walking, development of weakness in the legs, or with any other concerns.  Follow-up instructions discussed with patient: Patient urged to follow-up with PCP if pain does not improve with treatment and rest or if pain becomes recurrent.                                    Medical Decision Making Risk Prescription drug management.   Patient with back pain. No neurological deficits. Patient is ambulatory. No warning symptoms of back pain including: fecal incontinence, urinary retention or overflow incontinence, night sweats, waking from sleep with back pain, unexplained fevers or weight loss, h/o cancer, IVDU, recent trauma. No concern for cauda equina, epidural abscess, or other serious cause of back pain. Conservative measures such as rest, ice/heat and pain medicine indicated with PCP follow-up if no improvement with conservative management.       Final diagnoses:  Acute left-sided low back pain without  sciatica    ED Discharge Orders          Ordered    methylPREDNISolone  (MEDROL  DOSEPAK) 4 MG TBPK tablet        01/22/24 1047    cyclobenzaprine  (FLEXERIL ) 10 MG tablet  At bedtime PRN        01/22/24 1047               Desiderio Chew, PA-C 01/22/24 1049    Ruthe Cornet, DO 01/22/24 1054  "

## 2024-01-23 ENCOUNTER — Ambulatory Visit

## 2024-01-23 ENCOUNTER — Other Ambulatory Visit

## 2024-01-23 ENCOUNTER — Ambulatory Visit: Payer: Self-pay | Admitting: Family Medicine

## 2024-01-23 ENCOUNTER — Other Ambulatory Visit: Payer: Self-pay | Admitting: Family Medicine

## 2024-01-23 ENCOUNTER — Telehealth: Payer: Self-pay | Admitting: Family Medicine

## 2024-01-23 DIAGNOSIS — M545 Low back pain, unspecified: Secondary | ICD-10-CM

## 2024-01-23 LAB — CBC WITH DIFFERENTIAL/PLATELET
Basophils Absolute: 0.1 K/uL (ref 0.0–0.1)
Basophils Relative: 0.6 % (ref 0.0–3.0)
Eosinophils Absolute: 0 K/uL (ref 0.0–0.7)
Eosinophils Relative: 0.1 % (ref 0.0–5.0)
HCT: 40.8 % (ref 39.0–52.0)
Hemoglobin: 14 g/dL (ref 13.0–17.0)
Lymphocytes Relative: 20.9 % (ref 12.0–46.0)
Lymphs Abs: 1.7 K/uL (ref 0.7–4.0)
MCHC: 34.3 g/dL (ref 30.0–36.0)
MCV: 90.5 fl (ref 78.0–100.0)
Monocytes Absolute: 0.8 K/uL (ref 0.1–1.0)
Monocytes Relative: 9 % (ref 3.0–12.0)
Neutro Abs: 5.8 K/uL (ref 1.4–7.7)
Neutrophils Relative %: 69.4 % (ref 43.0–77.0)
Platelets: 223 K/uL (ref 150.0–400.0)
RBC: 4.51 Mil/uL (ref 4.22–5.81)
RDW: 14.2 % (ref 11.5–15.5)
WBC: 8.4 K/uL (ref 4.0–10.5)

## 2024-01-23 LAB — URINALYSIS
Bilirubin Urine: NEGATIVE
Hgb urine dipstick: NEGATIVE
Ketones, ur: NEGATIVE
Leukocytes,Ua: NEGATIVE
Nitrite: NEGATIVE
Specific Gravity, Urine: 1.01 (ref 1.000–1.030)
Total Protein, Urine: NEGATIVE
Urine Glucose: NEGATIVE
Urobilinogen, UA: 0.2 (ref 0.0–1.0)
pH: 6 (ref 5.0–8.0)

## 2024-01-23 MED ORDER — KETOROLAC TROMETHAMINE 60 MG/2ML IM SOLN
60.0000 mg | Freq: Once | INTRAMUSCULAR | Status: AC
  Administered 2024-01-23: 60 mg via INTRAMUSCULAR

## 2024-01-23 MED ORDER — KETOROLAC TROMETHAMINE 60 MG/2ML IM SOLN: 60.0000 mg | Freq: Once | INTRAMUSCULAR | Status: DC

## 2024-01-23 NOTE — Patient Instructions (Signed)
No NSAIDs for 24 hours

## 2024-01-23 NOTE — Telephone Encounter (Signed)
 Patient called asking if he would be able to be worked in with Dr Claudene today for left side low back pain. He was seen in the ED yesterday for this issue but it has continued.  Please advise.  (Dr Leonce does have a 3:30 this afternoon if you would prefer that I offer that?)

## 2024-01-23 NOTE — Telephone Encounter (Signed)
 Spoke with patient. Coming in for nurses visit today and to get labs and xrays.

## 2024-01-23 NOTE — Progress Notes (Signed)
 Patient received Toradol  60 mg in left buttocks. Patient tolerated well.

## 2024-02-08 ENCOUNTER — Ambulatory Visit: Admitting: Family Medicine

## 2024-02-10 NOTE — Progress Notes (Unsigned)
 " [facility] [address] [place] [address] Phone: [phone] Subjective:   Jacob [name] am a scribe for Dr. Claudene.   I'm seeing this patient by the request  of:  Ozell Heron HERO, MD  CC: Back and neck pain follow-up  YEP:Dlagzrupcz  Jacob [name]. is a 74 y.o. male coming in with complaint of back and neck pain. OMT 01/12/2024. Patient states that neck is pretty good. Back is decent. There is tightness and soreness in the right hip and buttocks area.   Medications patient has been prescribed: Vit D  Taking: no      Patient's x-rays do show some progression of arthritis but still moderate overall mostly at the L5-S1 area of the lumbar spine.   Reviewed prior external information including notes and imaging from previsou exam, outside providers and external EMR if available.   As well as notes that were available from care everywhere and other healthcare systems.  Past medical history, social, surgical and family history all reviewed in electronic medical record.  No pertanent information unless stated regarding to the chief complaint.   Past Medical History:  Diagnosis Date   Allergy    SEASONAL   Arthritis    SHOULDERS   BPH associated with nocturia    Hyperlipidemia    NOT ON MEDICATIONS    Allergies[1]   Review of Systems:  No headache, visual changes, nausea, vomiting, diarrhea, constipation, dizziness, abdominal pain, skin rash, fevers, chills, night sweats, weight loss, swollen lymph nodes, body aches, joint swelling, chest pain, shortness of breath, mood changes. POSITIVE muscle aches  Objective  Blood pressure 128/70, pulse 68, height 5' 11 (1.803 m), weight 177 lb (80.3 kg), SpO2 98%.   General: No apparent distress alert and oriented x3 mood and affect normal, dressed appropriately.  HEENT: Pupils equal, extraocular movements intact  Respiratory: Patient's speak in full sentences and does not appear  short of breath  Cardiovascular: No lower extremity edema, non tender, no erythema  Gait antalgic MSK:  Back does have some loss of lordosis.  Tightness noted in the right sacroiliac joint.  Positive Faber on the right.  Negative straight leg test noted.  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  Does respond usually well to osteopathic manipulation and after further evaluation to try it again today.  Discussed icing regimen and home exercises, discussed which activities to do and which ones to avoid.  Discussed core strengthening exercises.  Patient is already playing golf on a much more regular basis which was one of his goals for the year.  Follow-up again in 6 to 8 weeks otherwise.    Nonallopathic problems  Decision today to treat with OMT was based on Physical Exam  After verbal consent patient was treated with HVLA, ME, FPR techniques in cervical, rib, thoracic, lumbar, and sacral  areas  Patient tolerated the procedure well with improvement in symptoms  Patient given exercises, stretches and lifestyle modifications  See medications in patient instructions if given  Patient will follow up in 4-8 weeks    The above documentation has been reviewed and is accurate and complete Priya Matsen M Maryum Batterson, DO  Note: This dictation was prepared with Dragon dictation along with smaller phrase technology. Any transcriptional errors that result from this process are unintentional.            [1]  Allergies Allergen Reactions   Other Other (See Comments)    Pet dander, tree mold, ragweed, pollen cause sneezing and drainage   "

## 2024-02-13 ENCOUNTER — Ambulatory Visit: Admitting: Family Medicine

## 2024-02-13 ENCOUNTER — Encounter: Payer: Self-pay | Admitting: Family Medicine

## 2024-02-13 VITALS — BP 128/70 | HR 68 | Ht 71.0 in | Wt 177.0 lb

## 2024-02-13 DIAGNOSIS — M9904 Segmental and somatic dysfunction of sacral region: Secondary | ICD-10-CM | POA: Diagnosis not present

## 2024-02-13 DIAGNOSIS — M9908 Segmental and somatic dysfunction of rib cage: Secondary | ICD-10-CM

## 2024-02-13 DIAGNOSIS — M9903 Segmental and somatic dysfunction of lumbar region: Secondary | ICD-10-CM

## 2024-02-13 DIAGNOSIS — M9901 Segmental and somatic dysfunction of cervical region: Secondary | ICD-10-CM | POA: Diagnosis not present

## 2024-02-13 DIAGNOSIS — M9902 Segmental and somatic dysfunction of thoracic region: Secondary | ICD-10-CM | POA: Diagnosis not present

## 2024-02-13 DIAGNOSIS — M533 Sacrococcygeal disorders, not elsewhere classified: Secondary | ICD-10-CM | POA: Diagnosis not present

## 2024-02-13 NOTE — Patient Instructions (Addendum)
 Good to see you. Keep increasing activity.  See me again in 6 to 8 weeks.

## 2024-02-13 NOTE — Assessment & Plan Note (Signed)
 Chronic problem with exacerbation.  Does respond usually well to osteopathic manipulation and after further evaluation to try it again today.  Discussed icing regimen and home exercises, discussed which activities to do and which ones to avoid.  Discussed core strengthening exercises.  Patient is already playing golf on a much more regular basis which was one of his goals for the year.  Follow-up again in 6 to 8 weeks otherwise.

## 2024-03-13 ENCOUNTER — Ambulatory Visit: Admitting: Family Medicine

## 2024-04-05 ENCOUNTER — Ambulatory Visit: Admitting: Family Medicine

## 2024-04-19 ENCOUNTER — Encounter: Admitting: Family Medicine
